# Patient Record
Sex: Male | Born: 1980 | Hispanic: No
Health system: Southern US, Community
[De-identification: ages and names within clinical notes are randomized; demographics above are authoritative.]

---
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
## 2005-05-17 ENCOUNTER — Emergency Department (HOSPITAL_COMMUNITY): Admission: EM | Admit: 2005-05-17 | Discharge: 2005-05-17 | Payer: Self-pay | Admitting: Emergency Medicine

## 2008-07-16 ENCOUNTER — Emergency Department (HOSPITAL_COMMUNITY): Admission: EM | Admit: 2008-07-16 | Discharge: 2008-07-16 | Payer: Self-pay | Admitting: Emergency Medicine
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
## 2019-05-21 ENCOUNTER — Ambulatory Visit: Payer: Self-pay

## 2019-08-27 ENCOUNTER — Emergency Department (HOSPITAL_COMMUNITY)
Admission: EM | Admit: 2019-08-27 | Discharge: 2019-08-27 | Disposition: A | Discharge status: Home / Self Care | Payer: Medicaid Other | Attending: Emergency Medicine | Admitting: Emergency Medicine

## 2019-08-27 ENCOUNTER — Encounter (HOSPITAL_COMMUNITY): Payer: Self-pay | Admitting: *Deleted

## 2019-08-27 DIAGNOSIS — R1110 Vomiting, unspecified: Secondary | ICD-10-CM | POA: Diagnosis not present

## 2019-08-27 DIAGNOSIS — Z5321 Procedure and treatment not carried out due to patient leaving prior to being seen by health care provider: Secondary | ICD-10-CM | POA: Diagnosis not present

## 2019-08-27 DIAGNOSIS — R109 Unspecified abdominal pain: Secondary | ICD-10-CM | POA: Diagnosis not present

## 2019-08-27 LAB — CBC
HCT: 50.7 % (ref 39.0–52.0)
Hemoglobin: 16.8 g/dL (ref 13.0–17.0)
MCH: 31.6 pg (ref 26.0–34.0)
MCHC: 33.1 g/dL (ref 30.0–36.0)
MCV: 95.3 fL (ref 80.0–100.0)
Platelets: 294 10*3/uL (ref 150–400)
RBC: 5.32 MIL/uL (ref 4.22–5.81)
RDW: 13.2 % (ref 11.5–15.5)
WBC: 12.7 10*3/uL — ABNORMAL HIGH (ref 4.0–10.5)
nRBC: 0 % (ref 0.0–0.2)

## 2019-08-27 LAB — COMPREHENSIVE METABOLIC PANEL
ALT: 344 U/L — ABNORMAL HIGH (ref 0–44)
AST: 434 U/L — ABNORMAL HIGH (ref 15–41)
Albumin: 4.2 g/dL (ref 3.5–5.0)
Alkaline Phosphatase: 81 U/L (ref 38–126)
Anion gap: 10 (ref 5–15)
BUN: 14 mg/dL (ref 6–20)
CO2: 23 mmol/L (ref 22–32)
Calcium: 8.7 mg/dL — ABNORMAL LOW (ref 8.9–10.3)
Chloride: 106 mmol/L (ref 98–111)
Creatinine, Ser: 1.15 mg/dL (ref 0.61–1.24)
GFR calc Af Amer: >60 mL/min (ref >60)
GFR calc non Af Amer: >60 mL/min (ref >60)
Glucose, Bld: 121 mg/dL — ABNORMAL HIGH (ref 70–99)
Potassium: 3.7 mmol/L (ref 3.5–5.1)
Sodium: 139 mmol/L (ref 135–145)
Total Bilirubin: 1.3 mg/dL — ABNORMAL HIGH (ref 0.3–1.2)
Total Protein: 7.4 g/dL (ref 6.5–8.1)

## 2019-08-27 LAB — LIPASE, BLOOD: Lipase: 297 U/L — ABNORMAL HIGH (ref 11–51)

## 2019-08-27 NOTE — ED Notes (Signed)
Patient stated that he was going to leave couldn't stay any longer

## 2019-08-27 NOTE — ED Triage Notes (Signed)
To ED via [facility] for eval of abd pain and vomiting since last pm. Pt states he ate pizza around lunch yesterday.

## 2021-11-10 ENCOUNTER — Ambulatory Visit (HOSPITAL_COMMUNITY)
Admission: EM | Admit: 2021-11-10 | Discharge: 2021-11-11 | Disposition: A | Discharge status: Home / Self Care | Payer: Medicaid Other | Attending: Nurse Practitioner | Admitting: Nurse Practitioner

## 2021-11-10 DIAGNOSIS — Z7689 Persons encountering health services in other specified circumstances: Secondary | ICD-10-CM

## 2021-11-10 DIAGNOSIS — F419 Anxiety disorder, unspecified: Secondary | ICD-10-CM | POA: Insufficient documentation

## 2021-11-10 DIAGNOSIS — F909 Attention-deficit hyperactivity disorder, unspecified type: Secondary | ICD-10-CM | POA: Insufficient documentation

## 2021-11-10 NOTE — ED Triage Notes (Signed)
Pt presents to [facility] voluntarily, unaccompanied at this time. Pt reports that his wife passed away, but he does not believe that she is dead. Pt trailed on and hopped from one topic to another adn was hard to follow at times. pt also shared that he has been trying to get his son back as he was removed from the home due to lack of appropriate resources. Pt stated diagnosis of ADHD and anxiety and is being followed by [facility] psychiatric services. Pt is prescribed Seroquel, Klonopin and Adderral and pt stated he is compliant. Pt denies SI, HI, AVH and substance/alcohol use.
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
## 2021-11-11 NOTE — Discharge Instructions (Addendum)
  Discharge recommendations:  Patient is to take medications as prescribed. Please see information for follow-up appointment with psychiatry and therapy. Please follow up with your primary care provider for all medical related needs.   Therapy: We recommend that patient participate in individual therapy to address mental health concerns.  Medications: The parent/guardian is to contact a medical professional and/or outpatient provider to address any new side effects that develop. Parent/guardian should update outpatient providers of any new medications and/or medication changes.   Atypical antipsychotics: If you are prescribed an atypical antipsychotic, it is recommended that your height, weight, BMI, blood pressure, fasting lipid panel, and fasting blood sugar be monitored by your outpatient providers.  Safety:  The patient should abstain from use of illicit substances/drugs and abuse of any medications. If symptoms worsen or do not continue to improve or if the patient becomes actively suicidal or homicidal then it is recommended that the patient return to the closest hospital emergency department, the [facility], or call 911 for further evaluation and treatment. National Suicide Prevention Lifeline 1-800-SUICIDE or 3645148649.  About 988 988 offers 24/7 access to trained crisis counselors who can help people experiencing mental health-related distress. People can call or text 988 or chat 988lifeline.org for themselves or if they are worried about a loved one who may need crisis support.  Crisis Mobile: [facility]:                     (586) [phone] (for crisis response 24 hours a day) [place]:                                            [phone]    [place] Address: [address], [place], [place] [address] Hours:  Closed ? Opens 8?AM Phone: [phone]  [facility] [place]  [phone]  [place]. Address: [address], [place], [place] [address] Hours:  Closed ? Opens 9?AM Phone: [phone]   [facility] [address] [place], [place] [address] [email or web address] [employer] [address] [address] [place], [place] [address] [facility] and [place],  [address] [email or web address] [employer] [address] [place],  [address] [email or web address]

## 2021-11-11 NOTE — Progress Notes (Signed)
[facility] entered resources into AVS  Medina Degraffenreid [facility] 

## 2021-11-11 NOTE — ED Provider Notes (Signed)
Behavioral Health Urgent Care Medical Screening Exam  Patient Name: Tyler Thomas MRN: [id number] Date of Evaluation: 11/11/21 Chief Complaint: "I need my son back, and a mental health examination" Diagnosis:  Final diagnoses:  Encounter for psychiatric assessment    History of Present illness: Tyler Thomas is a 41 y.o. male.  With self-reported psychiatric history of ADHD and anxiety who presented to [facility] voluntarily as a walk-in requesting psychiatric evaluation/examination to fulfil a court ordered process to get his son back.  Patient denies SI, denies HI, denies AVH.  Patient describes his stressors as missing his court date on 11/09/21, his son living in foster care.  Patient reports "I was supposed to go to court on 25 October but I forgot because I had a job interview and I missed my court date but I need the job".  Reports he was given 2 different court dates, the first was October 18, and he was not notified of the new date Patient reports the court date was to see how he was going to be able to get his son back".  Patient reports "everybody is playing with me and even my whole family at this point.  I was talking to my wife's sister Lucita Ferrara and she sent me 58 photos of my wife that passed away which I don't think It's true, and they are giving me all these things to sign but I think they are doing all these because they are trying to take my son away from me".   Patient reports he does not know his wife's sister's number when provider requested for it. Patient reports "my wife's brother told me she died of a heart attack, but I don't believe him because she was never sick, and I keep seeing photos of her on Facebook".   Patient reports he was married for 12 to 80 years and describes his wife as a Corporate investment banker, Occupational psychologist, and liar and reports they split up 6 to 7 years ago and she left town a year and a half ago.   Patient reports "I was separated from my wife because she cheated on me  and she went to live with her dad and brother in [place] and for 2-1/2 years she did not do anything to try to get her son back, and her sister is trying to mess with me.  Patient reports my son Tyler Thomas is 68 years old, and he was staying with me but he got taken away after I lost my job and could not provide power to the home after I lost a lot of money in my [employer] account, and I could not get help from nobody and DSS took him and he is now in foster care, but the lady that has him will not let me talk to him.    Patient reports "I feel isolated like everyone has ghosted me and I want my son back and I am starting a new job on Wednesday, and now I got a car and an apartment.  Reports he lives alone and denies access to a gun or weapon.  Patient denies substance use or abuse.  Patient reports his sleep and appetite as good.  Patient reports he is followed by Dr.Kaur and is prescribed Seroquel, Klonopin, and Adderall for his ADHD and anxiety.  Patient is requesting the [facility] help him get in touch with his son's foster parent so he can talk to the lady (Amy in  [place]) as he is unable  to get in touch with her.  Both encouragement and reassurance provided about ongoing stressors.  Patient provided with opportunity for questions.  On evaluation, patient is alert, oriented x 3, and cooperative. Speech is clear and coherent. Pt appears casual. Eye contact is good. Mood is euthymic, affect is congruent with mood. Thought process and thought content is coherent. Pt denies SI/HI/AVH. There is no indication that the patient is responding to internal stimuli. No delusions elicited during this assessment.    Psychiatric Specialty Exam  Presentation  General Appearance:Appropriate for Environment  Eye Contact:Good  Speech:Clear and Coherent  Speech Volume:Normal  Handedness:Right   Mood and Affect  Mood: Euthymic  Affect: Congruent   Thought Process  Thought  Processes: Coherent  Descriptions of Associations:Intact  Orientation:Full (Time, Place and Person)  Thought Content:WDL    Hallucinations:None  Ideas of Reference:None  Suicidal Thoughts:No  Homicidal Thoughts:No   Sensorium  Memory: Immediate Good  Judgment: Fair  Insight: Good   Executive Functions  Concentration: Good  Attention Span: Good  Recall: Good  Fund of Knowledge: Good  Language: Good   Psychomotor Activity  Psychomotor Activity: Normal   Assets  Assets: Communication Skills; Desire for Improvement; Housing; Resilience   Sleep  Sleep: Good  Number of hours: No data recorded  Nutritional Assessment (For OBS and FBC admissions only) Has the patient had a weight loss or gain of 10 pounds or more in the last 3 months?: No Has the patient had a decrease in food intake/or appetite?: No Does the patient have dental problems?: No Does the patient have eating habits or behaviors that may be indicators of an eating disorder including binging or inducing vomiting?: No Has the patient recently lost weight without trying?: 0 Has the patient been eating poorly because of a decreased appetite?: 0 Malnutrition Screening Tool Score: 0    Physical Exam: Physical Exam Constitutional:      General: He is not in acute distress.    Appearance: He is normal weight. He is not diaphoretic.  HENT:     Head: Normocephalic.     Right Ear: External ear normal.     Left Ear: External ear normal.     Nose: No congestion.  Eyes:     General:        Right eye: No discharge.        Left eye: No discharge.  Pulmonary:     Effort: Pulmonary effort is normal. No respiratory distress.  Chest:     Chest wall: No tenderness.  Neurological:     Mental Status: He is alert and oriented to person, place, and time.  Psychiatric:        Attention and Perception: Attention and perception normal.        Mood and Affect: Mood and affect normal.         Speech: Speech normal.        Behavior: Behavior is cooperative.        Thought Content: Thought content normal. Thought content is not paranoid or delusional. Thought content does not include homicidal or suicidal ideation. Thought content does not include homicidal or suicidal plan.        Cognition and Memory: Cognition and memory normal.        Judgment: Judgment normal.    Review of Systems  Constitutional:  Negative for chills, diaphoresis and fever.  HENT:  Negative for congestion.   Eyes:  Negative for discharge.  Respiratory:  Negative for cough, shortness  of breath and wheezing.   Cardiovascular:  Negative for chest pain and palpitations.  Gastrointestinal:  Negative for diarrhea, nausea and vomiting.  Neurological:  Negative for tingling, seizures, loss of consciousness, weakness and headaches.  Psychiatric/Behavioral:  Negative for depression, hallucinations, memory loss, substance abuse and suicidal ideas. The patient is not nervous/anxious and does not have insomnia.    Blood pressure (!) 140/102, pulse (!) 121, temperature 97.9 F (36.6 C), temperature source Oral, resp. rate 15, SpO2 97 %. There is no height or weight on file to calculate BMI.  Musculoskeletal: Strength & Muscle Tone: within normal limits Gait & Station: normal Patient leans: N/A   [facility] Discharge Disposition for Follow up and Recommendations: Based on my evaluation the patient does not appear to have an emergency medical condition and can be discharged with resources and follow up care in outpatient services for Medication Management and Individual Therapy  Recommend discharge to home/self and follow-up with outpatient psychiatric provider for resources as needed.  Patient provided with outpatient resources by LCSW as requested.  Patient does not meet inpatient psychiatric admission criteria or IVC criteria.  There is no evidence of imminent risk of harm to self or others.  Please refrain from  using alcohol or illicit substances, as they can affect your mood and can cause depression, anxiety or other concerning symptoms.  Alcohol can increase the chance that a person will make reckless decisions, like attempting suicide, and can increase the lethality of a drug overdose.   Discussed calling crisis line, 911, or going to the ED if condition changes or worsens.    Discussed elevated blood pressure and heart rate, pt educated on risks of nontreatment.  Patient declined intervention/treatment at this time and verbalized his understanding.Discussed follow-up with PCP for medical issues, concerns, or health needs.  Disp-patient discharged and condition at discharge is stable.    Randon Goldsmith, NP 11/11/2021, 12:15 AM

## 2022-02-19 ENCOUNTER — Inpatient Hospital Stay (HOSPITAL_COMMUNITY)
Admission: EM | Admit: 2022-02-19 | Discharge: 2022-02-25 | DRG: 460 | Disposition: A | Discharge status: Home / Self Care | Payer: BC Managed Care – PPO | Attending: Neurological Surgery | Admitting: Neurological Surgery

## 2022-02-19 ENCOUNTER — Inpatient Hospital Stay (HOSPITAL_COMMUNITY): Payer: BC Managed Care – PPO

## 2022-02-19 ENCOUNTER — Emergency Department (HOSPITAL_COMMUNITY): Payer: BC Managed Care – PPO

## 2022-02-19 DIAGNOSIS — R569 Unspecified convulsions: Principal | ICD-10-CM

## 2022-02-19 DIAGNOSIS — S32039A Unspecified fracture of third lumbar vertebra, initial encounter for closed fracture: Secondary | ICD-10-CM | POA: Diagnosis present

## 2022-02-19 DIAGNOSIS — Z79899 Other long term (current) drug therapy: Secondary | ICD-10-CM

## 2022-02-19 DIAGNOSIS — G40409 Other generalized epilepsy and epileptic syndromes, not intractable, without status epilepticus: Secondary | ICD-10-CM | POA: Diagnosis present

## 2022-02-19 DIAGNOSIS — Z8673 Personal history of transient ischemic attack (TIA), and cerebral infarction without residual deficits: Secondary | ICD-10-CM

## 2022-02-19 DIAGNOSIS — T4276XA Underdosing of unspecified antiepileptic and sedative-hypnotic drugs, initial encounter: Secondary | ICD-10-CM | POA: Diagnosis present

## 2022-02-19 DIAGNOSIS — Z602 Problems related to living alone: Secondary | ICD-10-CM | POA: Diagnosis present

## 2022-02-19 DIAGNOSIS — M48061 Spinal stenosis, lumbar region without neurogenic claudication: Secondary | ICD-10-CM | POA: Diagnosis present

## 2022-02-19 DIAGNOSIS — G40109 Localization-related (focal) (partial) symptomatic epilepsy and epileptic syndromes with simple partial seizures, not intractable, without status epilepticus: Secondary | ICD-10-CM | POA: Diagnosis not present

## 2022-02-19 DIAGNOSIS — S32001A Stable burst fracture of unspecified lumbar vertebra, initial encounter for closed fracture: Secondary | ICD-10-CM | POA: Diagnosis not present

## 2022-02-19 DIAGNOSIS — S32041A Stable burst fracture of fourth lumbar vertebra, initial encounter for closed fracture: Principal | ICD-10-CM | POA: Diagnosis present

## 2022-02-19 DIAGNOSIS — W182XXA Fall in (into) shower or empty bathtub, initial encounter: Secondary | ICD-10-CM | POA: Diagnosis present

## 2022-02-19 DIAGNOSIS — F419 Anxiety disorder, unspecified: Secondary | ICD-10-CM | POA: Diagnosis present

## 2022-02-19 LAB — CBC
HCT: 44.6 % (ref 39.0–52.0)
Hemoglobin: 15.5 g/dL (ref 13.0–17.0)
MCH: 31.8 pg (ref 26.0–34.0)
MCHC: 34.8 g/dL (ref 30.0–36.0)
MCV: 91.4 fL (ref 80.0–100.0)
Platelets: 256 10*3/uL (ref 150–400)
RBC: 4.88 MIL/uL (ref 4.22–5.81)
RDW: 12.5 % (ref 11.5–15.5)
WBC: 15.7 10*3/uL — ABNORMAL HIGH (ref 4.0–10.5)
nRBC: 0 % (ref 0.0–0.2)

## 2022-02-19 LAB — RAPID URINE DRUG SCREEN, HOSP PERFORMED
Amphetamines: NOT DETECTED
Barbiturates: NOT DETECTED
Benzodiazepines: NOT DETECTED
Cocaine: NOT DETECTED
Opiates: NOT DETECTED
Tetrahydrocannabinol: NOT DETECTED

## 2022-02-19 LAB — I-STAT CHEM 8, ED
BUN: 15 mg/dL (ref 6–20)
Calcium, Ion: 1.13 mmol/L — ABNORMAL LOW (ref 1.15–1.40)
Chloride: 108 mmol/L (ref 98–111)
Creatinine, Ser: 1 mg/dL (ref 0.61–1.24)
Glucose, Bld: 127 mg/dL — ABNORMAL HIGH (ref 70–99)
HCT: 47 % (ref 39.0–52.0)
Hemoglobin: 16 g/dL (ref 13.0–17.0)
Potassium: 4.3 mmol/L (ref 3.5–5.1)
Sodium: 138 mmol/L (ref 135–145)
TCO2: 18 mmol/L — ABNORMAL LOW (ref 22–32)

## 2022-02-19 LAB — CBG MONITORING, ED: Glucose-Capillary: 140 mg/dL — ABNORMAL HIGH (ref 70–99)

## 2022-02-19 LAB — ETHANOL: Alcohol, Ethyl (B): <10 mg/dL (ref <10)

## 2022-02-19 LAB — CREATININE, SERUM
Creatinine, Ser: 1.04 mg/dL (ref 0.61–1.24)
GFR, Estimated: >60 mL/min (ref >60)

## 2022-02-19 LAB — SURGICAL PCR SCREEN
MRSA, PCR: NEGATIVE
Staphylococcus aureus: POSITIVE — AB

## 2022-02-19 MED ORDER — LEVETIRACETAM IN NACL 1000 MG/100ML IV SOLN
1000.0000 mg | Freq: Once | INTRAVENOUS | Status: AC
  Administered 2022-02-19: 1000 mg via INTRAVENOUS
  Filled 2022-02-19: qty 100

## 2022-02-19 MED ORDER — CLONAZEPAM 0.25 MG PO TBDP
2.0000 mg | ORAL_TABLET | Freq: Two times a day (BID) | ORAL | Status: DC
  Administered 2022-02-19 – 2022-02-25 (×11): 2 mg via ORAL
  Filled 2022-02-19 (×11): qty 8

## 2022-02-19 MED ORDER — ENOXAPARIN SODIUM 40 MG/0.4ML IJ SOSY
40.0000 mg | PREFILLED_SYRINGE | INTRAMUSCULAR | Status: DC
  Administered 2022-02-19: 40 mg via SUBCUTANEOUS
  Filled 2022-02-19: qty 0.4

## 2022-02-19 MED ORDER — LEVETIRACETAM 500 MG PO TABS
500.0000 mg | ORAL_TABLET | Freq: Two times a day (BID) | ORAL | Status: DC
  Administered 2022-02-19 – 2022-02-25 (×12): 500 mg via ORAL
  Filled 2022-02-19 (×12): qty 1

## 2022-02-19 MED ORDER — DOCUSATE SODIUM 100 MG PO CAPS
100.0000 mg | ORAL_CAPSULE | Freq: Two times a day (BID) | ORAL | Status: DC
  Administered 2022-02-19 – 2022-02-20 (×3): 100 mg via ORAL
  Filled 2022-02-19 (×3): qty 1

## 2022-02-19 MED ORDER — GADOBUTROL 1 MMOL/ML IV SOLN
8.5000 mL | Freq: Once | INTRAVENOUS | Status: AC | PRN
  Administered 2022-02-19: 8.5 mL via INTRAVENOUS

## 2022-02-19 MED ORDER — RINGERS IV SOLN: INTRAVENOUS | Status: DC

## 2022-02-19 MED ORDER — KETOROLAC TROMETHAMINE 15 MG/ML IJ SOLN
15.0000 mg | Freq: Three times a day (TID) | INTRAMUSCULAR | Status: AC | PRN
  Administered 2022-02-19 – 2022-02-23 (×7): 15 mg via INTRAVENOUS
  Filled 2022-02-19 (×7): qty 1

## 2022-02-19 MED ORDER — LORAZEPAM 2 MG/ML IJ SOLN
1.0000 mg | Freq: Once | INTRAMUSCULAR | Status: AC
  Administered 2022-02-19: 1 mg via INTRAVENOUS
  Filled 2022-02-19: qty 1

## 2022-02-19 MED ORDER — MUPIROCIN 2 % EX OINT
1.0000 | TOPICAL_OINTMENT | Freq: Two times a day (BID) | CUTANEOUS | Status: AC
  Administered 2022-02-19 – 2022-02-24 (×9): 1 via NASAL
  Filled 2022-02-19 (×4): qty 22

## 2022-02-19 NOTE — ED Triage Notes (Signed)
Pt BIBA [facility] for lumbar back pain s/p seizures ~3hrs ago. [facility] reports pt ran out of klonopin 3 days ago, usually takes 2x2mg  daily. Hx of seizures when running out before. Denies head trauma, no blood thinners. Bit his tongue. Pt talking very low in triage, mostly answering with nods and head shakes. A&Ox3. VSS

## 2022-02-19 NOTE — ED Provider Notes (Signed)
[place] [facility] Provider Note   CSN: [id number] Arrival date & time: 02/19/22  3790     History  Chief Complaint  Patient presents with   Seizures    Tyler Thomas is a 42 y.o. male.  HPI 11 male with presents today with history of seizure disorder, not taking his Klonopin for several days and reports 2 seizures today.  He states he remembers having the seizures.  He was in the shower and was sitting and then was on the ground.  Denies striking his head or loss of consciousness.    Home Medications Prior to Admission medications   Not on File      Allergies    Patient has no known allergies.    Review of Systems   Review of Systems  Physical Exam Updated Vital Signs BP 125/79   Pulse 82   Temp 98 F (36.7 C) (Oral)   Resp 17   Ht 1.829 m (6')   Wt 86.2 kg   SpO2 96%   BMI 25.77 kg/m  Physical Exam Vitals and nursing note reviewed.  Constitutional:      Appearance: Normal appearance. He is well-developed.  HENT:     Head: Normocephalic and atraumatic.     Right Ear: External ear normal.     Left Ear: External ear normal.     Nose: Nose normal.     Mouth/Throat:     Comments: Tongue contusion Eyes:     Extraocular Movements: Extraocular movements intact.  Neck:     Trachea: No tracheal deviation.  Cardiovascular:     Rate and Rhythm: Normal rate and regular rhythm.  Pulmonary:     Effort: Pulmonary effort is normal.  Abdominal:     General: Abdomen is flat.     Palpations: Abdomen is soft.  Musculoskeletal:        General: Normal range of motion.     Cervical back: Normal range of motion and neck supple.     Comments: Some tenderness over mid lumbar spine No obvious external signs of trauma noted  Skin:    General: Skin is warm and dry.  Neurological:     Mental Status: He is alert and oriented to person, place, and time. Mental status is at baseline.     Cranial Nerves: No cranial nerve deficit.      Sensory: No sensory deficit.     Motor: No weakness.     Coordination: Coordination normal.  Psychiatric:        Mood and Affect: Mood normal.        Behavior: Behavior normal.     ED Results / Procedures / Treatments   Labs (all labs ordered are listed, but only abnormal results are displayed) Labs Reviewed  CBG MONITORING, ED - Abnormal; Notable for the following components:      Result Value   Glucose-Capillary 140 (*)    All other components within normal limits  I-STAT CHEM 8, ED - Abnormal; Notable for the following components:   Glucose, Bld 127 (*)    Calcium, Ion 1.13 (*)    TCO2 18 (*)    All other components within normal limits  RAPID URINE DRUG SCREEN, HOSP PERFORMED  ETHANOL    EKG None  Radiology CT Lumbar Spine Wo Contrast  Result Date: 02/19/2022 CLINICAL DATA:  Back trauma with no prior imaging. EXAM: CT LUMBAR SPINE WITHOUT CONTRAST TECHNIQUE: Multidetector CT imaging of the lumbar spine was  performed without intravenous contrast administration. Multiplanar CT image reconstructions were also generated. RADIATION DOSE REDUCTION: This exam was performed according to the departmental dose-optimization program which includes automated exposure control, adjustment of the mA and/or kV according to patient size and/or use of iterative reconstruction technique. COMPARISON:  07/16/2008 radiography discectomy FINDINGS: Segmentation: The T12 ribs are hypoplastic based on prior radiography. This gives 5 lumbar type vertebrae. Alignment: Physiologic Vertebrae: Comminuted L4 body fracture with anterior displacement of anterior fragments and advanced central height loss. The posterior arch is intact with nondisplaced left transverse process fracture at this level. mild posterosuperior bony retropulsion exacerbated by congenitally narrow spinal canal. L3 superior endplate fracture with mild depression, age-indeterminate. Chronic appearing T11 superior endplate fracture. Paraspinal  and other soft tissues: Paravertebral swelling at the level of fracture. No discrete hematoma. Disc levels: No herniation seen.  L4-5 spinal stenosis. IMPRESSION: 1. Burst type fracture of the L4 body. 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 Nondisplaced left transverse process fracture at this level. 2. L3 superior endplate fracture with mild depression, age-indeterminate. 3. T11 superior endplate fracture which appears chronic. Electronically Signed   By: Tiburcio Pea M.D.   On: 02/19/2022 08:27   CT Head Wo Contrast  Result Date: 02/19/2022 CLINICAL DATA:  Mental status changes.  Seizure. EXAM: CT HEAD WITHOUT CONTRAST TECHNIQUE: Contiguous axial images were obtained from the base of the skull through the vertex without intravenous contrast. RADIATION DOSE REDUCTION: This exam was performed according to the departmental dose-optimization program which includes automated exposure control, adjustment of the mA and/or kV according to patient size and/or use of iterative reconstruction technique. COMPARISON:  12/22/2018 FINDINGS: Brain: There is no evidence for acute hemorrhage, hydrocephalus, mass lesion, or abnormal extra-axial fluid collection. No definite CT evidence for acute infarction. 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 Vascular: No hyperdense vessel or unexpected calcification. Skull: No evidence for fracture. No worrisome lytic or sclerotic lesion. Sinuses/Orbits: Chronic mucosal thickening noted in the sphenoid sinuses and scattered ethmoid air cells. Mastoid sinuses are clear bilaterally. Visualized portions of the globes and intraorbital fat are unremarkable. Other: None. IMPRESSION: 1. 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 Otherwise no acute findings on today's exam. 2. Chronic paranasal sinus  disease. Electronically Signed   By: Kennith Center M.D.   On: 02/19/2022 08:24    Procedures .Critical Care  Performed by: Margarita Grizzle, MD Authorized by: Margarita Grizzle, MD   Critical care provider statement:    Critical care time (minutes):  65   Critical care was necessary to treat or prevent imminent or life-threatening deterioration of the following conditions:  Trauma and CNS failure or compromise   Critical care was time spent personally by me on the following activities:  Development of treatment plan with patient or surrogate, discussions with consultants, evaluation of patient's response to treatment, examination of patient, ordering and review of laboratory studies, ordering and review of radiographic studies, ordering and performing treatments and interventions, pulse oximetry, re-evaluation of patient's condition and review of old charts     Medications Ordered in ED Medications  LORazepam (ATIVAN) injection 1 mg (1 mg Intravenous Given 02/19/22 0743)  levETIRAcetam (KEPPRA) IVPB 1000 mg/100 mL premix (1,000 mg Intravenous New Bag/Given 02/19/22 1033)    ED Course/ Medical Decision Making/ A&P Clinical Course as of 02/19/22 1114  Sun Feb 19, 2022  0902 I-STAT 8 reviewed and interpreted and is significant for mildly decreased CO2 at 18 otherwise essentially within normal limits Alcohol is normal at 10  [DR]  1015 CT reviewed interpreted significant for burst type fracture of L4 body with posterior superior corner retropulsed bone mild degree but spinal stenosis high-grade given congenitally short pedicles [DR]  1016 L3 superior endplate fracture with mild depression [DR]    Clinical Course User Index [DR] Pattricia Boss, MD   {  11:14 AM Patient awake and alert Medical Decision Making Amount and/or Complexity of Data Reviewed Labs: ordered. Radiology: ordered.  Risk Prescription drug management.   42 year old male with history of known seizure disorder presents today  after seizure.  Patient reports that he has not been taking his Klonopin for the past several days because he ran out of it.  He has been drinking some alcohol.  He states that he has pain in his back since the seizure when he fell in the shower.  He denies striking his head or loss of consciousness.  In fact, he reports that he remembers the seizure.  However, he has multiple contusions on his tongue that would be consistent with having a grand mal seizure. Differential diagnosis includes but is not limited to seizure due to lack of compliance, seizure due to alcohol withdrawal, other intracerebral event, orthostasis, trauma, acute metabolic abnormalities   Co morbidities that complicate the patient evaluation  Will call you send seizure disorder   Additional history obtained:  Additional history obtained from history obtained from piton R and D a nurse External records from outside source obtained and reviewed including reviewed behavioral health urgent care note from 11/11/2021   Lab Tests:  I Ordered, and personally interpreted labs.  The pertinent results include: Sodium potassium chloride BUN/creatinine within normal limits Glucose mildly elevated at 127 Hemoglobin 16 Urine drug screen negative Alcohol less than 10   Imaging Studies ordered:  I ordered imaging studies including CT head and lumbar spine I independently visualized and interpreted imaging which showed CT head shows old frontal infarct no acute abnormalities CT spine shows burst fracture of L3 I agree with the radiologist interpretation   Cardiac Monitoring: / EKG:  The patient was maintained on a cardiac monitor.  I personally viewed and interpreted the cardiac monitored which showed an underlying rhythm of: Patient maintained on monitor with blood pressure 115/68 and heart rate normal   Consultations Obtained:  I requested consultation with the neurosurgery,  and discussed lab and imaging findings as well  as pertinent plan - they recommend: admission for surgical decompression   Problem List / ED Course / Critical interventions / Medication management  Seizure due to noncompliance with medications Burst fracture L3 due to seizure I ordered medication including Keppra and Ativan for seizure Reevaluation of the patient after these medicines showed that the patient stayed the same I have reviewed the patients home medicines and have made adjustments as needed   Social Determinants of Health:  Medication access   Test / Admission - Considered:  Mri-Discussed with Dr. Ellene Route he will obtain this is inpatient 1- seizure with known seizure disorder due to noncompliance with medications- out of meds  Patient treated here with Keppra and Ativan is awake and alert 2- l3 fracture secondary to #  Patient is neurologically intact care discussed with Dr. Ellene Route and will be admitted for decompression Final Clinical Impression(s) / ED Diagnoses Final diagnoses:  Seizure ([place])  Closed fracture of third  lumbar vertebra, unspecified fracture morphology, initial encounter [facility])    Rx / DC Orders ED Discharge Orders     None         Pattricia Boss, MD 02/19/22 1115

## 2022-02-19 NOTE — Consult Note (Signed)
Neurology Consultation Reason for Consult: Seizures  Requesting Physician: Kristeen Miss   CC: Seizure  History is obtained from: Patient and chart review   HPI: Tyler Thomas is a 42 y.o. male with a past medical history significant for anxiety and possibly ADHD/ADD on clonazepam, quetiapine and Adderall)  Spell # Started in his 77s,  - Semiology: Whole body stiffening, sometimes falls back. No bowel/bladder incontinence but always bites tongue  - Prodome: Stiffening all over reported to myself.  However to neurosurgery team the patient reported right arm twitching and flailing followed by loss of consciousness - Post-spell: Confused, difficulty talking, does not think he has clear unilateral weakness - Triggers: always in the setting of missed benzos (takes daily for anxiety) - Frequency: 4-5 lifetime events   Risk factors:  Birth and development: unknown  Febrile seizures in childhood: negative Significant head trauma: 2 episodes of LoC when hitting head during seizure Intracranial surgeries: No Mengingitis/Encephalitis history: No Family history of seizures or developmental delay: No  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   No past medical history on file.  On my review: Patient was unaware of prior stroke history.  Aware of only psychiatric history as documented above  No family history on file.  On my review: Notes seizure disorder in his brother as well which he thinks are triggered in the same setting  Social History:  He reports he does drink alcohol occasionally socially but denies any other substance use  Exam: Current vital signs: BP (!) 106/59 (BP Location: Left Arm)   Pulse 82   Temp 98.7 F (37.1 C) (Oral)   Resp 18   Ht 6' (1.829 m)   Wt 87.2 kg   SpO2 95%   BMI 26.07 kg/m  Vital signs in last 24  hours: Temp:  [97.8 F (36.6 C)-98.7 F (37.1 C)] 98.7 F (37.1 C) (02/04 2302) Pulse Rate:  [76-90] 82 (02/04 2302) Resp:  [16-22] 18 (02/04 2302) BP: (106-125)/(59-85) 106/59 (02/04 2302) SpO2:  [94 %-97 %] 95 % (02/04 2302) Weight:  [86.2 kg-87.2 kg] 87.2 kg (02/04 1251)   Physical Exam  Constitutional: Appears well-developed and well-nourished.  Winces with pain with any movement Psych: Affect flat Eyes: No scleral injection HENT: No oropharyngeal obstruction.  MSK: no joint deformities.  Cardiovascular: Normal rate and regular rhythm. Perfusing extremities well Respiratory: Effort normal, non-labored breathing GI: Soft.  No distension. There is no tenderness.  Skin: Warm dry and intact visible skin  Neuro: Mental Status: Patient is very sleepy at the time of my evaluation at but is oriented to person, place, month, year, and situation. Patient is able to give a clear and coherent history. No signs of aphasia or neglect Cranial Nerves: II: Visual Fields are full. Pupils are equal, round, and reactive to light.   III,IV, VI: EOMI without ptosis or diploplia.  V: Facial sensation is symmetric to light touch (reports pain in his face) VII: Facial movement is symmetric.  VIII: hearing is intact to voice X: Uvula elevates symmetrically XI: Shoulder shrug is symmetric. XII: tongue is midline without atrophy or fasciculations.  Motor: Confrontational strength testing deferred due to unstable fracture and pain, but he is using all 4 extremities slightly to adjust himself and grossly equal strength on this evaluation.  Slight pronation of bilateral upper extremities without drift Sensory:  Sensation is symmetric to light touch in all 4 extremities Cerebellar: Finger-nose intact bilaterally Gait:  Deferred due to unstable fracture   I have reviewed labs in epic and the results pertinent to this consultation are:  Basic Metabolic Panel: Recent Labs  Lab 02/19/22 0736  02/19/22 1532  NA 138  --   K 4.3  --   CL 108  --   GLUCOSE 127*  --   BUN 15  --   CREATININE 1.00 1.04    CBC: Recent Labs  Lab 02/19/22 0736 02/19/22 1532  WBC  --  15.7*  HGB 16.0 15.5  HCT 47.0 44.6  MCV  --  91.4  PLT  --  256   I have reviewed the images obtained:  MRI brain personally reviewed, agree with radiology:  1. No acute intracranial abnormality. 2. 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   Impression: Given the semiology described to neurosurgery (perhaps not described similarly to me due to patient's sleepiness and fatigue at the time of my evaluation) which correlates well with possible seizure focus on MRI brain, as well as multiple prior seizures, he meets criteria for focal epilepsy.  Recommendations:  # Focal seizures with secondary generalization -Routine EEG pending for completeness -Continue Keppra 500 twice daily at this time -Long-term would consider Depakote or lamotrigine as these medications can have dual effect for mood disorder in addition to seizure control.  Holding off on Depakote for right now due to potential for thrombocytopenia and need for urgent neurosurgical intervention.  Long-acting benzodiazepine such as Onfi would be another good option -Would taper short acting benzodiazepines with the help of his outpatient psychiatrist and transition to other mood controlling agents due to this recurrently being his seizure trigger - Neurology will follow up EEG but otherwise be available as needed, ambulatory referral to [facility] will be placed for close follow-up.  In discharge instructions family/patient should also be provided with the number to call for an appointment: [phone] Address: [address] [place], [place] [address]  # Remote stroke - ASA 81 mg daily for secondary prevention when cleared from neurosurgical perspective -  Outpatient workup given chronicity   [place] [phone]

## 2022-02-19 NOTE — H&P (Addendum)
Tyler Thomas is an 42 y.o. male.   Chief Complaint: L4 fracture status post grand mal seizure HPI: The patient is a 42 year old male who has a history of seizures in the past.  He notes that he takes Klonopin 2 mg twice a day and he had ran out of the medication the last 24 hours or so.  He was in the shower and had sat down as he felt unusual and he noticed his right arm started to twitch and flail uncontrollably.  He then does not remember anything.  He was brought into the hospital for evaluation was noted to have severe back pain a CT scan demonstrates the presence of an L4 compression fracture that appears to have a burst pattern with superior propulse fragment.  The patient has a congenital spinal stenosis and with the retropulsed fragment he has severe stenosis at the L4-L5 level.  Complains of severe pain in his back and he finds that he is bit more comfortable when he keeps his legs flexed.  He is admitted as it appears his fracture is unstable and will likely need surgical decompression and stabilization.  The history of seizure is a little less trait forward.  It appears that the patient has had 2 other seizures and each time they are related to withdrawal or abrupt stoppage of a benzodiazepine previously Xanax and this time Klonopin.  Whether he has a primary seizure disorder needs some clarification in addition to the best form of treatment for him needs to be ascertained.  He has been loaded with Keppra at this point and is being continued on Keppra.  No past medical history on file.  No past surgical history on file.  No family history on file. Social History:  has no history on file for tobacco use, alcohol use, and drug use.  Allergies: No Known Allergies  Medications Prior to Admission  Medication Sig Dispense Refill   amphetamine-dextroamphetamine (ADDERALL) 20 MG tablet Take 20 mg by mouth daily.     clonazePAM (KLONOPIN) 2 MG tablet Take 2 mg by mouth daily.     QUEtiapine  (SEROQUEL) 200 MG tablet Take 50-100 mg by mouth at bedtime as needed (sleep).      Results for orders placed or performed during the hospital encounter of 02/19/22 (from the past 48 hour(s))  CBG monitoring, ED     Status: Abnormal   Collection Time: 02/19/22  7:18 AM  Result Value Ref Range   Glucose-Capillary 140 (H) 70 - 99 mg/dL    Comment: Glucose reference range applies only to samples taken after fasting for at least 8 hours.  Ethanol     Status: None   Collection Time: 02/19/22  7:25 AM  Result Value Ref Range   Alcohol, Ethyl (B) <10 <10 mg/dL    Comment: (NOTE) Lowest detectable limit for serum alcohol is 10 mg/dL.  For medical purposes only. Performed at [facility], [place] [address], [place], [place] [address]   I-stat chem 8, ED (not at [facility], [facility] or [facility])     Status: Abnormal   Collection Time: 02/19/22  7:36 AM  Result Value Ref Range   Sodium 138 135 - 145 mmol/L   Potassium 4.3 3.5 - 5.1 mmol/L   Chloride 108 98 - 111 mmol/L   BUN 15 6 - 20 mg/dL   Creatinine, Ser 1.00 0.61 - 1.24 mg/dL   Glucose, Bld 127 (H) 70 - 99 mg/dL    Comment: Glucose reference range  applies only to samples taken after fasting for at least 8 hours.   Calcium, Ion 1.13 (L) 1.15 - 1.40 mmol/L   TCO2 18 (L) 22 - 32 mmol/L   Hemoglobin 16.0 13.0 - 17.0 g/dL   HCT 47.0 39.0 - 52.0 %  Rapid urine drug screen (hospital performed)     Status: None   Collection Time: 02/19/22  9:38 AM  Result Value Ref Range   Opiates NONE DETECTED NONE DETECTED   Cocaine NONE DETECTED NONE DETECTED   Benzodiazepines NONE DETECTED NONE DETECTED   Amphetamines NONE DETECTED NONE DETECTED   Tetrahydrocannabinol NONE DETECTED NONE DETECTED   Barbiturates NONE DETECTED NONE DETECTED    Comment: (NOTE) DRUG SCREEN FOR MEDICAL PURPOSES ONLY.  IF CONFIRMATION IS NEEDED FOR ANY PURPOSE, NOTIFY LAB WITHIN 5 DAYS.  LOWEST DETECTABLE LIMITS FOR URINE DRUG SCREEN Drug Class                      Cutoff (ng/mL) Amphetamine and metabolites    1000 Barbiturate and metabolites    200 Benzodiazepine                 200 Opiates and metabolites        300 Cocaine and metabolites        300 THC                            50 Performed at [facility], [place] [address], [place], [place] [address]    CT Lumbar Spine Wo Contrast  Result Date: 02/19/2022 CLINICAL DATA:  Back trauma with no prior imaging. EXAM: CT LUMBAR SPINE WITHOUT CONTRAST TECHNIQUE: Multidetector CT imaging of the lumbar spine was performed without intravenous contrast administration. Multiplanar CT image reconstructions were also generated. RADIATION DOSE REDUCTION: This exam was performed according to the departmental dose-optimization program which includes automated exposure control, adjustment of the mA and/or kV according to patient size and/or use of iterative reconstruction technique. COMPARISON:  07/16/2008 radiography discectomy FINDINGS: Segmentation: The T12 ribs are hypoplastic based on prior radiography. This gives 5 lumbar type vertebrae. Alignment: Physiologic Vertebrae: Comminuted L4 body fracture with anterior displacement of anterior fragments and advanced central height loss. The posterior arch is intact with nondisplaced left transverse process fracture at this level. mild posterosuperior bony retropulsion exacerbated by congenitally narrow spinal canal. L3 superior endplate fracture with mild depression, age-indeterminate. Chronic appearing T11 superior endplate fracture. Paraspinal and other soft tissues: Paravertebral swelling at the level of fracture. No discrete hematoma. Disc levels: No herniation seen.  L4-5 spinal stenosis. IMPRESSION: 1. Burst type fracture of the L4 body. 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 Nondisplaced left transverse process fracture at this level. 2. L3 superior endplate fracture  with mild depression, age-indeterminate. 3. T11 superior endplate fracture which appears chronic. Electronically Signed   By: Jorje Guild M.D.   On: 02/19/2022 08:27   CT Head Wo Contrast  Result Date: 02/19/2022 CLINICAL DATA:  Mental status changes.  Seizure. EXAM: CT HEAD WITHOUT CONTRAST TECHNIQUE: Contiguous axial images were obtained from the base of the skull through the vertex without intravenous contrast. RADIATION DOSE REDUCTION: This exam was performed according to the departmental dose-optimization program which includes automated exposure control, adjustment of the mA and/or kV according to patient size and/or use of iterative reconstruction technique. COMPARISON:  12/22/2018 FINDINGS: Brain: There  is no evidence for acute hemorrhage, hydrocephalus, mass lesion, or abnormal extra-axial fluid collection. No definite CT evidence for acute infarction. 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 Vascular: No hyperdense vessel or unexpected calcification. Skull: No evidence for fracture. No worrisome lytic or sclerotic lesion. Sinuses/Orbits: Chronic mucosal thickening noted in the sphenoid sinuses and scattered ethmoid air cells. Mastoid sinuses are clear bilaterally. Visualized portions of the globes and intraorbital fat are unremarkable. Other: None. IMPRESSION: 1. 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 Otherwise no acute findings on today's exam. 2. Chronic paranasal sinus disease. Electronically Signed   By: Misty Stanley M.D.   On: 02/19/2022 08:24    Review of Systems  Psychiatric/Behavioral:  The patient is nervous/anxious.   All other systems reviewed and are negative.   Blood pressure 120/79, pulse 76, temperature 98.5 F (36.9 C), temperature source Oral, resp. rate 18, height 6' (1.829 m), weight 87.2 kg, SpO2 96 %. Physical Exam Constitutional:      Appearance: Normal  appearance. He is normal weight.  HENT:     Head: Normocephalic and atraumatic.     Right Ear: Tympanic membrane, ear canal and external ear normal.     Left Ear: Tympanic membrane, ear canal and external ear normal.     Nose: Nose normal.     Mouth/Throat:     Mouth: Mucous membranes are moist.     Pharynx: Oropharynx is clear.     Comments: Patient has bitten his tongue severely and has significant swelling in the tongue Eyes:     Extraocular Movements: Extraocular movements intact.     Conjunctiva/sclera: Conjunctivae normal.     Pupils: Pupils are equal, round, and reactive to light.  Cardiovascular:     Rate and Rhythm: Normal rate and regular rhythm.     Pulses: Normal pulses.     Heart sounds: Normal heart sounds.  Pulmonary:     Effort: Pulmonary effort is normal.     Breath sounds: Normal breath sounds.  Abdominal:     General: Abdomen is flat. Bowel sounds are normal.     Palpations: Abdomen is soft.  Musculoskeletal:     Cervical back: Normal range of motion and neck supple.     Comments: Complains of pain in the left forearm.  Requests an x-ray  Skin:    General: Skin is warm and dry.     Capillary Refill: Capillary refill takes less than 2 seconds.  Neurological:     General: No focal deficit present.     Mental Status: He is alert.  Psychiatric:        Mood and Affect: Mood normal.        Behavior: Behavior normal.        Thought Content: Thought content normal.        Judgment: Judgment normal.      Assessment/Plan #1 L4 burst fracture with high-grade stenosis L4. 2.  Seizure disorder and seizure management.  Klonopin withdrawal  Plan: We will arrange a time to undergo surgical D compression and stabilization of the L4 vertebrae.  I like the patient to undergo an MRI of the lumbar spine prior to this.  He will remain at bedrest.  Will start a diet today.  Regarding the seizure disorder I would kindly request neurologic consultation to determine what the  best course of action in terms of management of this patient's medications  and his seizure would be.  The patient notes that he currently does not have a neurologist.  Stefani Dama, MD 02/19/2022, 3:14 PM

## 2022-02-19 NOTE — Progress Notes (Signed)
Pt admitted to the unit from [facility] ED via care link. Pt A&O x4; pt oriented to unit and room, seizure precaution protocol initiated, skin assessment completed with 2 RN per protocol. Pt skin intact with no opened wound or pressure ulcer. VSS, telemetry applied and verified with CCMD per protocol. Dr. Ellene Route called and notified of pt's arrival to the unit. Pt in bed with call light within reach. Will continue to closely monitor. Francis Gaines Renisha Cockrum RN   02/19/22 1251  Vitals  Temp 98.5 F (36.9 C)  Temp Source Oral  BP 120/79  MAP (mmHg) 93  BP Location Left Arm  BP Method Automatic  Patient Position (if appropriate) Lying  Pulse Rate 76  Pulse Rate Source Monitor  Resp 18  Level of Consciousness  Level of Consciousness Alert  MEWS COLOR  MEWS Score Color Green  Oxygen Therapy  SpO2 96 %  O2 Device Room Air  Height and Weight  Height 6' (1.829 m)  Weight 87.2 kg  Type of Scale Used Bed  Type of Weight Actual  BSA (Calculated - sq m) 2.1 sq meters  BMI (Calculated) 26.07  Weight in (lb) to have BMI = 25 183.9  MEWS Score  MEWS Temp 0  MEWS Systolic 0  MEWS Pulse 0  MEWS RR 0  MEWS LOC 0  MEWS Score 0

## 2022-02-20 ENCOUNTER — Other Ambulatory Visit: Payer: Self-pay | Admitting: Neurological Surgery

## 2022-02-20 ENCOUNTER — Inpatient Hospital Stay (HOSPITAL_COMMUNITY): Payer: BC Managed Care – PPO

## 2022-02-20 DIAGNOSIS — R569 Unspecified convulsions: Secondary | ICD-10-CM | POA: Diagnosis not present

## 2022-02-20 MED ORDER — LACTATED RINGERS IV SOLN: INTRAVENOUS | Status: DC

## 2022-02-20 NOTE — Progress Notes (Signed)
Pt transported off unit to MRI. P. Amo Dondra Rhett RN 

## 2022-02-20 NOTE — Progress Notes (Signed)
Pt back to the unit from MRI. Pt in bed with bed alarm on and call light within reach. Delia Heady RN

## 2022-02-20 NOTE — Procedures (Signed)
Patient Name: Tyler Thomas  MRN: [id number]  Epilepsy Attending: Lora Havens  Referring Physician/Provider: Donnetta Simpers, MD  Date: 02/20/2022 Duration: 22.17 mins  Patient history: 42 year old male with seizure-like activity.  EEG Drolet for seizure.  Level of alertness: Awake  AEDs during EEG study: Keppra, clonazepam  Technical aspects: This EEG study was done with scalp electrodes positioned according to the 10-20 International system of electrode placement. Electrical activity was reviewed with band pass filter of 1-70Hz , sensitivity of 7 uV/mm, display speed of 62mm/sec with a 60Hz  notched filter applied as appropriate. EEG data were recorded continuously and digitally stored.  Video monitoring was available and reviewed as appropriate.  Description: The posterior dominant rhythm consists of 9-10 Hz activity of moderate voltage (25-35 uV) seen predominantly in posterior head regions, symmetric and reactive to eye opening and eye closing. There is 15 to 18 Hz beta activity distributed symmetrically and diffusely.  Hyperventilation and photic stimulation were not performed.     IMPRESSION: This study is within normal limits. No seizures or epileptiform discharges were seen throughout the recording.  A normal interictal EEG does not exclude the diagnosis of epilepsy.  [name] Barbra Sarks

## 2022-02-20 NOTE — Anesthesia Preprocedure Evaluation (Signed)
Anesthesia Evaluation  Patient identified by MRN, date of birth, ID band Patient awake    Reviewed: Allergy & Precautions, NPO status , Patient's Chart, lab work & pertinent test results  History of Anesthesia Complications Negative for: history of anesthetic complications  Airway Mallampati: II  TM Distance: >3 FB Neck ROM: Full    Dental no notable dental hx.    Pulmonary neg pulmonary ROS   Pulmonary exam normal        Cardiovascular negative cardio ROS Normal cardiovascular exam     Neuro/Psych L4 Burst Fracture    GI/Hepatic negative GI ROS, Neg liver ROS,,,  Endo/Other  negative endocrine ROS    Renal/GU negative Renal ROS     Musculoskeletal   Abdominal   Peds  Hematology negative hematology ROS (+)   Anesthesia Other Findings Day of surgery medications reviewed with patient.  Reproductive/Obstetrics                              Anesthesia Physical Anesthesia Plan  ASA: 2  Anesthesia Plan: General   Post-op Pain Management: Tylenol PO (pre-op)* and Ketamine IV*   Induction: Intravenous  PONV Risk Score and Plan: 3 and Treatment may vary due to age or medical condition, Ondansetron, Dexamethasone and Midazolam  Airway Management Planned: Oral ETT  Additional Equipment: None  Intra-op Plan:   Post-operative Plan: Extubation in OR  Informed Consent: I have reviewed the patients History and Physical, chart, labs and discussed the procedure including the risks, benefits and alternatives for the proposed anesthesia with the patient or authorized representative who has indicated his/her understanding and acceptance.     Dental advisory given  Plan Discussed with: CRNA  Anesthesia Plan Comments:         Anesthesia Quick Evaluation

## 2022-02-20 NOTE — Progress Notes (Signed)
Patient ID: Tyler Thomas, male   DOB: September 27, 1980, 42 y.o.   MRN: [id number] Vital signs stable Motor function is good in lower extremities but patient in severe pain with any mobilization Plan for decompression and stabilization in am Discussed with patient, he agrees. Will obtain mri lumbar and consent.

## 2022-02-20 NOTE — H&P (View-Only) (Signed)
EEG complete - results pending 

## 2022-02-20 NOTE — Progress Notes (Signed)
EEG complete - results pending 

## 2022-02-21 ENCOUNTER — Inpatient Hospital Stay (HOSPITAL_COMMUNITY): Payer: BC Managed Care – PPO | Admitting: Certified Registered"

## 2022-02-21 ENCOUNTER — Inpatient Hospital Stay (HOSPITAL_COMMUNITY): Payer: BC Managed Care – PPO

## 2022-02-21 ENCOUNTER — Other Ambulatory Visit: Payer: Self-pay

## 2022-02-21 ENCOUNTER — Inpatient Hospital Stay (HOSPITAL_COMMUNITY)
Admission: EM | Disposition: A | Discharge status: Home / Self Care | Payer: Self-pay | Source: Home / Self Care | Attending: Neurological Surgery

## 2022-02-21 HISTORY — PX: HARVEST BONE GRAFT: SHX377

## 2022-02-21 SURGERY — POSTERIOR LUMBAR FUSION 2 LEVEL
Anesthesia: General | Site: Back

## 2022-02-21 MED ORDER — 0.9 % SODIUM CHLORIDE (POUR BTL) OPTIME
TOPICAL | Status: DC | PRN
  Administered 2022-02-21: 1000 mL

## 2022-02-21 MED ORDER — SODIUM CHLORIDE 0.9% FLUSH: 3.0000 mL | INTRAVENOUS | Status: DC | PRN

## 2022-02-21 MED ORDER — MORPHINE SULFATE (PF) 2 MG/ML IV SOLN
2.0000 mg | INTRAVENOUS | Status: DC | PRN
  Administered 2022-02-22 – 2022-02-23 (×2): 4 mg via INTRAVENOUS
  Filled 2022-02-21 (×2): qty 2

## 2022-02-21 MED ORDER — EPHEDRINE 5 MG/ML INJ
INTRAVENOUS | Status: AC
  Filled 2022-02-21: qty 5

## 2022-02-21 MED ORDER — PROPOFOL 10 MG/ML IV BOLUS
INTRAVENOUS | Status: AC
  Filled 2022-02-21: qty 20

## 2022-02-21 MED ORDER — FENTANYL CITRATE (PF) 250 MCG/5ML IJ SOLN
INTRAMUSCULAR | Status: AC
  Filled 2022-02-21: qty 5

## 2022-02-21 MED ORDER — PROPOFOL 10 MG/ML IV BOLUS
INTRAVENOUS | Status: DC | PRN
  Administered 2022-02-21: 180 mg via INTRAVENOUS

## 2022-02-21 MED ORDER — ONDANSETRON HCL 4 MG/2ML IJ SOLN
INTRAMUSCULAR | Status: AC
  Filled 2022-02-21: qty 2

## 2022-02-21 MED ORDER — DEXAMETHASONE SODIUM PHOSPHATE 10 MG/ML IJ SOLN
INTRAMUSCULAR | Status: AC
  Filled 2022-02-21: qty 1

## 2022-02-21 MED ORDER — LIDOCAINE-EPINEPHRINE 1 %-1:100000 IJ SOLN
INTRAMUSCULAR | Status: DC | PRN
  Administered 2022-02-21: 5 mL

## 2022-02-21 MED ORDER — PHENYLEPHRINE 80 MCG/ML (10ML) SYRINGE FOR IV PUSH (FOR BLOOD PRESSURE SUPPORT)
PREFILLED_SYRINGE | INTRAVENOUS | Status: AC
  Filled 2022-02-21: qty 10

## 2022-02-21 MED ORDER — DROPERIDOL 2.5 MG/ML IJ SOLN: 0.6250 mg | Freq: Once | INTRAMUSCULAR | Status: DC | PRN

## 2022-02-21 MED ORDER — ACETAMINOPHEN 10 MG/ML IV SOLN
INTRAVENOUS | Status: AC
  Filled 2022-02-21: qty 100

## 2022-02-21 MED ORDER — HYDROMORPHONE HCL 1 MG/ML IJ SOLN
INTRAMUSCULAR | Status: AC
  Filled 2022-02-21: qty 0.5

## 2022-02-21 MED ORDER — MIDAZOLAM HCL 2 MG/2ML IJ SOLN
INTRAMUSCULAR | Status: AC
  Filled 2022-02-21: qty 2

## 2022-02-21 MED ORDER — SODIUM CHLORIDE 0.9% FLUSH
3.0000 mL | Freq: Two times a day (BID) | INTRAVENOUS | Status: DC
  Administered 2022-02-21 – 2022-02-23 (×6): 3 mL via INTRAVENOUS

## 2022-02-21 MED ORDER — DEXAMETHASONE SODIUM PHOSPHATE 10 MG/ML IJ SOLN
INTRAMUSCULAR | Status: DC | PRN
  Administered 2022-02-21: 10 mg via INTRAVENOUS

## 2022-02-21 MED ORDER — CEFAZOLIN SODIUM-DEXTROSE 2-4 GM/100ML-% IV SOLN
2.0000 g | Freq: Three times a day (TID) | INTRAVENOUS | Status: AC
  Administered 2022-02-21 (×2): 2 g via INTRAVENOUS
  Filled 2022-02-21 (×2): qty 100

## 2022-02-21 MED ORDER — PHENOL 1.4 % MT LIQD: 1.0000 | OROMUCOSAL | Status: DC | PRN

## 2022-02-21 MED ORDER — BUPIVACAINE HCL (PF) 0.5 % IJ SOLN
INTRAMUSCULAR | Status: AC
  Filled 2022-02-21: qty 30

## 2022-02-21 MED ORDER — KETAMINE HCL 10 MG/ML IJ SOLN
INTRAMUSCULAR | Status: DC | PRN
  Administered 2022-02-21: 20 mg via INTRAVENOUS
  Administered 2022-02-21 (×3): 10 mg via INTRAVENOUS

## 2022-02-21 MED ORDER — SODIUM CHLORIDE 0.9 % IV SOLN: 250.0000 mL | INTRAVENOUS | Status: DC

## 2022-02-21 MED ORDER — PHENYLEPHRINE HCL-NACL 20-0.9 MG/250ML-% IV SOLN
INTRAVENOUS | Status: DC | PRN
  Administered 2022-02-21: 20 ug/min via INTRAVENOUS

## 2022-02-21 MED ORDER — HYDROMORPHONE HCL 1 MG/ML IJ SOLN
INTRAMUSCULAR | Status: DC | PRN
  Administered 2022-02-21: .5 mg via INTRAVENOUS

## 2022-02-21 MED ORDER — THROMBIN 5000 UNITS EX SOLR
OROMUCOSAL | Status: DC | PRN
  Administered 2022-02-21 (×2): 5 mL via TOPICAL

## 2022-02-21 MED ORDER — LIDOCAINE-EPINEPHRINE 1 %-1:100000 IJ SOLN
INTRAMUSCULAR | Status: AC
  Filled 2022-02-21: qty 1

## 2022-02-21 MED ORDER — BUPIVACAINE HCL (PF) 0.5 % IJ SOLN
INTRAMUSCULAR | Status: DC | PRN
  Administered 2022-02-21: 25 mL

## 2022-02-21 MED ORDER — DOCUSATE SODIUM 100 MG PO CAPS
100.0000 mg | ORAL_CAPSULE | Freq: Two times a day (BID) | ORAL | Status: DC
  Administered 2022-02-21 – 2022-02-25 (×9): 100 mg via ORAL
  Filled 2022-02-21 (×9): qty 1

## 2022-02-21 MED ORDER — THROMBIN 5000 UNITS EX SOLR
CUTANEOUS | Status: AC
  Filled 2022-02-21: qty 5000

## 2022-02-21 MED ORDER — LEVETIRACETAM IN NACL 500 MG/100ML IV SOLN
500.0000 mg | Freq: Once | INTRAVENOUS | Status: AC
  Administered 2022-02-21: 500 mg via INTRAVENOUS
  Filled 2022-02-21: qty 100

## 2022-02-21 MED ORDER — LIDOCAINE 2% (20 MG/ML) 5 ML SYRINGE
INTRAMUSCULAR | Status: DC | PRN
  Administered 2022-02-21: 100 mg via INTRAVENOUS

## 2022-02-21 MED ORDER — CEFAZOLIN SODIUM-DEXTROSE 2-3 GM-%(50ML) IV SOLR
INTRAVENOUS | Status: DC | PRN
  Administered 2022-02-21: 2 g via INTRAVENOUS

## 2022-02-21 MED ORDER — LACTATED RINGERS IV SOLN: INTRAVENOUS | Status: DC

## 2022-02-21 MED ORDER — LIDOCAINE 2% (20 MG/ML) 5 ML SYRINGE
INTRAMUSCULAR | Status: AC
  Filled 2022-02-21: qty 5

## 2022-02-21 MED ORDER — MENTHOL 3 MG MT LOZG: 1.0000 | LOZENGE | OROMUCOSAL | Status: DC | PRN

## 2022-02-21 MED ORDER — HYDROMORPHONE HCL 1 MG/ML IJ SOLN: 0.2500 mg | INTRAMUSCULAR | Status: DC | PRN

## 2022-02-21 MED ORDER — BISACODYL 10 MG RE SUPP: 10.0000 mg | Freq: Every day | RECTAL | Status: DC | PRN

## 2022-02-21 MED ORDER — METHOCARBAMOL 1000 MG/10ML IJ SOLN
500.0000 mg | Freq: Four times a day (QID) | INTRAVENOUS | Status: DC | PRN
  Filled 2022-02-21 (×2): qty 5

## 2022-02-21 MED ORDER — OXYCODONE-ACETAMINOPHEN 5-325 MG PO TABS
1.0000 | ORAL_TABLET | ORAL | Status: DC | PRN
  Administered 2022-02-21 – 2022-02-25 (×14): 2 via ORAL
  Filled 2022-02-21 (×14): qty 2

## 2022-02-21 MED ORDER — ONDANSETRON HCL 4 MG/2ML IJ SOLN: 4.0000 mg | Freq: Four times a day (QID) | INTRAMUSCULAR | Status: DC | PRN

## 2022-02-21 MED ORDER — QUETIAPINE FUMARATE 100 MG PO TABS
100.0000 mg | ORAL_TABLET | Freq: Every day | ORAL | Status: DC
  Administered 2022-02-21 – 2022-02-24 (×4): 100 mg via ORAL
  Filled 2022-02-21 (×4): qty 1

## 2022-02-21 MED ORDER — ROCURONIUM BROMIDE 10 MG/ML (PF) SYRINGE
PREFILLED_SYRINGE | INTRAVENOUS | Status: DC | PRN
  Administered 2022-02-21: 30 mg via INTRAVENOUS
  Administered 2022-02-21: 60 mg via INTRAVENOUS
  Administered 2022-02-21: 20 mg via INTRAVENOUS
  Administered 2022-02-21: 40 mg via INTRAVENOUS

## 2022-02-21 MED ORDER — ENOXAPARIN SODIUM 40 MG/0.4ML IJ SOSY
40.0000 mg | PREFILLED_SYRINGE | INTRAMUSCULAR | Status: DC
  Administered 2022-02-22 – 2022-02-25 (×4): 40 mg via SUBCUTANEOUS
  Filled 2022-02-21 (×4): qty 0.4

## 2022-02-21 MED ORDER — ACETAMINOPHEN 325 MG PO TABS: 650.0000 mg | ORAL_TABLET | ORAL | Status: DC | PRN

## 2022-02-21 MED ORDER — FLEET ENEMA 7-19 GM/118ML RE ENEM: 1.0000 | ENEMA | Freq: Once | RECTAL | Status: DC | PRN

## 2022-02-21 MED ORDER — SUGAMMADEX SODIUM 200 MG/2ML IV SOLN
INTRAVENOUS | Status: DC | PRN
  Administered 2022-02-21: 200 mg via INTRAVENOUS

## 2022-02-21 MED ORDER — ROCURONIUM BROMIDE 10 MG/ML (PF) SYRINGE
PREFILLED_SYRINGE | INTRAVENOUS | Status: AC
  Filled 2022-02-21: qty 10

## 2022-02-21 MED ORDER — ACETAMINOPHEN 10 MG/ML IV SOLN
INTRAVENOUS | Status: DC | PRN
  Administered 2022-02-21: 1000 mg via INTRAVENOUS

## 2022-02-21 MED ORDER — KETAMINE HCL 50 MG/5ML IJ SOSY
PREFILLED_SYRINGE | INTRAMUSCULAR | Status: AC
  Filled 2022-02-21: qty 5

## 2022-02-21 MED ORDER — METHOCARBAMOL 500 MG PO TABS
500.0000 mg | ORAL_TABLET | Freq: Four times a day (QID) | ORAL | Status: DC | PRN
  Administered 2022-02-21 – 2022-02-25 (×8): 500 mg via ORAL
  Filled 2022-02-21 (×8): qty 1

## 2022-02-21 MED ORDER — MIDAZOLAM HCL 2 MG/2ML IJ SOLN
INTRAMUSCULAR | Status: DC | PRN
  Administered 2022-02-21: 2 mg via INTRAVENOUS

## 2022-02-21 MED ORDER — ACETAMINOPHEN 650 MG RE SUPP: 650.0000 mg | RECTAL | Status: DC | PRN

## 2022-02-21 MED ORDER — ONDANSETRON HCL 4 MG PO TABS: 4.0000 mg | ORAL_TABLET | Freq: Four times a day (QID) | ORAL | Status: DC | PRN

## 2022-02-21 MED ORDER — FENTANYL CITRATE (PF) 250 MCG/5ML IJ SOLN
INTRAMUSCULAR | Status: DC | PRN
  Administered 2022-02-21 (×2): 50 ug via INTRAVENOUS
  Administered 2022-02-21: 100 ug via INTRAVENOUS
  Administered 2022-02-21: 50 ug via INTRAVENOUS

## 2022-02-21 MED ORDER — ALUM & MAG HYDROXIDE-SIMETH 200-200-20 MG/5ML PO SUSP: 30.0000 mL | Freq: Four times a day (QID) | ORAL | Status: DC | PRN

## 2022-02-21 MED ORDER — SENNA 8.6 MG PO TABS
1.0000 | ORAL_TABLET | Freq: Two times a day (BID) | ORAL | Status: DC
  Administered 2022-02-21 – 2022-02-25 (×9): 8.6 mg via ORAL
  Filled 2022-02-21 (×9): qty 1

## 2022-02-21 MED ORDER — POLYETHYLENE GLYCOL 3350 17 G PO PACK: 17.0000 g | PACK | Freq: Every day | ORAL | Status: DC | PRN

## 2022-02-21 SURGICAL SUPPLY — 69 items
ADH SKN CLS APL DERMABOND .7 (GAUZE/BANDAGES/DRESSINGS) ×2
APL SRG 60D 8 XTD TIP BNDBL (TIP)
BAG COUNTER SPONGE SURGICOUNT (BAG) ×2 IMPLANT
BAG SPNG CNTER NS LX DISP (BAG) ×4
BASKET BONE COLLECTION (BASKET) ×2 IMPLANT
BLADE BONE MILL MEDIUM (MISCELLANEOUS) ×2 IMPLANT
BLADE CLIPPER SURG (BLADE) IMPLANT
BUR MATCHSTICK NEURO 3.0 LAGG (BURR) ×2 IMPLANT
CANISTER SUCT 3000ML PPV (MISCELLANEOUS) ×2 IMPLANT
CNTNR URN SCR LID CUP LEK RST (MISCELLANEOUS) ×2 IMPLANT
CONT SPEC 4OZ STRL OR WHT (MISCELLANEOUS) ×2
COVER BACK TABLE 60X90IN (DRAPES) ×2 IMPLANT
DERMABOND ADVANCED .7 DNX12 (GAUZE/BANDAGES/DRESSINGS) ×2 IMPLANT
DEVICE DISSECT PLASMABLAD 3.0S (MISCELLANEOUS) ×2 IMPLANT
DRAPE C-ARM 42X72 X-RAY (DRAPES) ×4 IMPLANT
DRAPE C-ARMOR (DRAPES) IMPLANT
DRAPE HALF SHEET 40X57 (DRAPES) IMPLANT
DRAPE LAPAROTOMY 100X72X124 (DRAPES) ×2 IMPLANT
DRSG OPSITE POSTOP 4X8 (GAUZE/BANDAGES/DRESSINGS) IMPLANT
DURAPREP 26ML APPLICATOR (WOUND CARE) ×2 IMPLANT
DURASEAL APPLICATOR TIP (TIP) IMPLANT
DURASEAL SPINE SEALANT 3ML (MISCELLANEOUS) IMPLANT
ELECT REM PT RETURN 9FT ADLT (ELECTROSURGICAL) ×2
ELECTRODE REM PT RTRN 9FT ADLT (ELECTROSURGICAL) ×2 IMPLANT
GAUZE 4X4 16PLY ~~LOC~~+RFID DBL (SPONGE) IMPLANT
GAUZE SPONGE 4X4 12PLY STRL (GAUZE/BANDAGES/DRESSINGS) ×2 IMPLANT
GLOVE BIO SURGEON STRL SZ7.5 (GLOVE) IMPLANT
GLOVE BIOGEL PI IND STRL 7.5 (GLOVE) IMPLANT
GLOVE BIOGEL PI IND STRL 8.5 (GLOVE) ×4 IMPLANT
GLOVE ECLIPSE 8.5 STRL (GLOVE) ×4 IMPLANT
GOWN STRL REUS W/ TWL LRG LVL3 (GOWN DISPOSABLE) IMPLANT
GOWN STRL REUS W/ TWL XL LVL3 (GOWN DISPOSABLE) IMPLANT
GOWN STRL REUS W/TWL 2XL LVL3 (GOWN DISPOSABLE) ×4 IMPLANT
GOWN STRL REUS W/TWL LRG LVL3 (GOWN DISPOSABLE) ×2
GOWN STRL REUS W/TWL XL LVL3 (GOWN DISPOSABLE) ×4
HEMOSTAT POWDER KIT SURGIFOAM (HEMOSTASIS) ×2 IMPLANT
KIT BASIN OR (CUSTOM PROCEDURE TRAY) ×2 IMPLANT
KIT GRAFTMAG DEL NEURO DISP (NEUROSURGERY SUPPLIES) IMPLANT
KIT TURNOVER KIT B (KITS) ×2 IMPLANT
MILL BONE PREP (MISCELLANEOUS) ×2 IMPLANT
NDL SPNL 18GX3.5 QUINCKE PK (NEEDLE) IMPLANT
NEEDLE HYPO 22GX1.5 SAFETY (NEEDLE) ×2 IMPLANT
NEEDLE SPNL 18GX3.5 QUINCKE PK (NEEDLE) ×2 IMPLANT
NS IRRIG 1000ML POUR BTL (IV SOLUTION) ×2 IMPLANT
PACK LAMINECTOMY NEURO (CUSTOM PROCEDURE TRAY) ×2 IMPLANT
PAD ARMBOARD 7.5X6 YLW CONV (MISCELLANEOUS) ×6 IMPLANT
PATTIES SURGICAL .5 X1 (DISPOSABLE) ×2 IMPLANT
PLASMABLADE 3.0S (MISCELLANEOUS) ×2
ROD RELIN-O LORD 5.5X65MM (Rod) IMPLANT
SCREW LOCK RELINE 5.5 TULIP (Screw) IMPLANT
SCREW RELINE 6.5X35 POLYAXIAL (Screw) IMPLANT
SCREW RELINE-O POLY 6.5X45 (Screw) IMPLANT
SPIKE FLUID TRANSFER (MISCELLANEOUS) ×2 IMPLANT
SPONGE SURGIFOAM ABS GEL 100 (HEMOSTASIS) IMPLANT
SPONGE T-LAP 4X18 ~~LOC~~+RFID (SPONGE) IMPLANT
SUT PROLENE 6 0 BV (SUTURE) IMPLANT
SUT VIC AB 1 CT1 18XBRD ANBCTR (SUTURE) ×2 IMPLANT
SUT VIC AB 1 CT1 8-18 (SUTURE) ×2
SUT VIC AB 2-0 CP2 18 (SUTURE) ×2 IMPLANT
SUT VIC AB 3-0 SH 8-18 (SUTURE) ×2 IMPLANT
SUT VIC AB 4-0 RB1 18 (SUTURE) ×2 IMPLANT
SYR 20ML ECCENTRIC (SYRINGE) IMPLANT
SYR 3ML LL SCALE MARK (SYRINGE) ×8 IMPLANT
SYR 5ML LL (SYRINGE) IMPLANT
TOWEL GREEN STERILE (TOWEL DISPOSABLE) ×2 IMPLANT
TOWEL GREEN STERILE FF (TOWEL DISPOSABLE) ×2 IMPLANT
TRAY FOLEY MTR SLVR 14FR STAT (SET/KITS/TRAYS/PACK) IMPLANT
TRAY FOLEY MTR SLVR 16FR STAT (SET/KITS/TRAYS/PACK) ×2 IMPLANT
WATER STERILE IRR 1000ML POUR (IV SOLUTION) ×2 IMPLANT

## 2022-02-21 NOTE — Anesthesia Postprocedure Evaluation (Signed)
Anesthesia Post Note  Patient: Tyler Thomas  Procedure(s) Performed: Lumbar three to Lumbar five decompression Posterior fixation and open reduction ;harvest bone graft left illiac crest (Back) HARVEST ILIAC BONE GRAFT     Patient location during evaluation: PACU Anesthesia Type: General Level of consciousness: awake and alert Pain management: pain level controlled Vital Signs Assessment: post-procedure vital signs reviewed and stable Respiratory status: spontaneous breathing, nonlabored ventilation and respiratory function stable Cardiovascular status: blood pressure returned to baseline Postop Assessment: no apparent nausea or vomiting Anesthetic complications: no   No notable events documented.  Last Vitals:  Vitals:   02/21/22 1230 02/21/22 1249  BP: (!) 116/90 117/89  Pulse: 97 99  Resp: 11 14  Temp: 37.1 C 37.1 C  SpO2: 95% 92%    Last Pain:  Vitals:   02/21/22 1249  TempSrc: Oral  PainSc: 0-No pain                 Marthenia Rolling

## 2022-02-21 NOTE — Interval H&P Note (Signed)
History and Physical Interval Note:  02/21/2022 7:40 AM  Tyler Thomas  has presented today for surgery, with the diagnosis of L4 Burst Fracture.  The various methods of treatment have been discussed with the patient and family. After consideration of risks, benefits and other options for treatment, the patient has consented to  Procedure(s): L3 to L5 Posterior fixation and open reduction (N/A) as a surgical intervention.  The patient's history has been reviewed, patient examined, no change in status, stable for surgery.  I have reviewed the patient's chart and labs.  Questions were answered to the patient's satisfaction.     Earleen Newport

## 2022-02-21 NOTE — Anesthesia Procedure Notes (Signed)
Procedure Name: Intubation Date/Time: 02/21/2022 7:59 AM  Performed by: Mosetta Pigeon, CRNAPre-anesthesia Checklist: Patient identified, Emergency Drugs available, Suction available and Patient being monitored Patient Re-evaluated:Patient Re-evaluated prior to induction Oxygen Delivery Method: Circle System Utilized Preoxygenation: Pre-oxygenation with 100% oxygen Induction Type: IV induction Ventilation: Mask ventilation without difficulty and Oral airway inserted - appropriate to patient size Laryngoscope Size: Mac and 4 Grade View: Grade I Tube type: Oral Tube size: 7.5 mm Number of attempts: 1 Airway Equipment and Method: Stylet and Oral airway Placement Confirmation: ETT inserted through vocal cords under direct vision, positive ETCO2 and breath sounds checked- equal and bilateral Secured at: 21 cm Tube secured with: Tape Dental Injury: Teeth and Oropharynx as per pre-operative assessment

## 2022-02-21 NOTE — Transfer of Care (Signed)
Immediate Anesthesia Transfer of Care Note  Patient: [name]  Procedure(s) Performed: Lumbar three to Lumbar five decompression Posterior fixation and open reduction ;harvest bone graft left illiac crest (Back)  Patient Location: PACU  Anesthesia Type:General  Level of Consciousness: awake, alert , and oriented  Airway & Oxygen Therapy: Patient Spontanous Breathing and Patient connected to face mask oxygen  Post-op Assessment: Report given to RN and Post -op Vital signs reviewed and stable  Post vital signs: Reviewed and stable  Last Vitals:  Vitals Value Taken Time  BP 122/91 02/21/22 1200  Temp    Pulse 95 02/21/22 1203  Resp 13 02/21/22 1203  SpO2 94 % 02/21/22 1203  Vitals shown include unvalidated device data.  Last Pain:  Vitals:   02/21/22 0309  TempSrc: Oral  PainSc:       Patients Stated Pain Goal: 2 (22/63/33 5456)  Complications: No notable events documented.

## 2022-02-21 NOTE — Op Note (Signed)
Date of surgery: 02/21/2022 Preoperative diagnosis: L4 burst fracture with spinal stenosis Postoperative diagnosis: Same Procedure: Decompression of L4 burst fracture and posterior stabilization L3-L5 with pedicle screw fixation segmentally and posterolateral arthrodesis using local autograft obtained from the posterior superior iliac crest on the left through a separate fascial incision. Surgeon: Kristeen Miss First Assistant: Deatra Ina, MD Anesthesia: General endotracheal Indications: Tyler Thomas is a 42 year old male who sustained a seizure.  In the course of his seizure he developed a burst fracture of the L4 vertebrae with a pincer type configuration.  He had a superior endplate fracture that retropulsed bone into the canal he has congenital spinal stenosis and this made the spinal stenosis at the L3-4 level much more severe.  He had intractable back pain.  He was advised regarding the need for surgical decompression and stabilization.  Procedure the patient was brought to the operating room supine on the stretcher.  After the smooth induction of general tracheal anesthesia, he was carefully turned prone.  The back was prepped with alcohol DuraPrep and draped in a sterile fashion.  A midline incision was created in the lower lumbar spine this was carried down to the lumbodorsal fascia.  The fascia was opened on either side of midline to expose the spinous process of L4 which was verified radiographically.  Dissection was then carried up to L3 to expose the L3-L4 facet joints inferiorly and out to the L3 transverse process.  Dissection exposed then the L for L5 joints all the way out to the transverse process the transverse process of L4 was also decorticated and this was packed away for later use and grafting.  Then a decompression was undertaken by creating bilateral large laminotomies between L3 and L4.  The thickened redundant yellow ligament was taken up in this area.  There is noted to be a  slight compression with a slight twisting of the spinous process offset to the right at L4 behind the L3 spinous process.  This area was cleared in the laminectomy was completed to allow good decompression of the common dural tube and the takeoff of the L4 nerve root inferiorly.  This was checked bilaterally hemostasis in the epidural veins was obtained with some bipolar cautery and pledgets of Gelfoam soaked in thrombin which were later irrigated away once the decompression was obtained attention was turned to the posterior suprailiac crest on the left side.  Then through a separate fascial incision over the crest itself we open the gluteal fascia and dissected the gluteus muscle away from the outer table of the ilium.  The ridge at the posterior suprailiac crest was then opened using a small straight osteotome and strips of cortical cancellous bone were obtained from this region.  The underlying cancellous bone was then harvested using a curved gouge.  When adequate sample of bone was obtained for the fusion hemostasis was achieved by reflecting the muscle back over the bleeding surface of the bone.  The fascia was closed with #1 Vicryl in interrupted fashion.  2-0 Vicryl was then used in the superficial portion of the fascia.  Attention was then turned to the lateral gutters which were then packed away and the cortical cancellous strips were laid in the lateral beds of the intertransverse space between L3 and L5.  At this point pedicle entry sites were chosen using fluoroscopic guidance 6.5 x 45 mm screws were placed in L3 6.5 x 35 mm screws were placed and L4 and 6.5 x 45 mm screws were placed  and L5 these were checked and verified for no evidence of cut out and good positioning in the pedicles and vertebral bodies.  Then precontoured rods were connected between the screw heads and a neutral construct.  This was done with 65 mm precontoured rods.  The system was torqued down and final radiographs were obtained  in AP and lateral projections.  Good alignment of the vertebrae was obtained and when this was verified the remainder of the bone that was harvested from the laminotomies and the posterior suprailiac crest was packed into the lateral gutters.  Hemostasis was carefully obtained in the wound and then the lumbodorsal fascia was closed with #1 Vicryl interrupted fashion 2-0 Vicryl was used in the subcutaneous tissues 3-0 and 4-0 Vicryl in the final subcuticular closure blood loss for the procedure was estimated 700 cc and 315 cc of Cell Saver blood was returned to the patient patient was then returned to recovery room in stable condition.

## 2022-02-21 NOTE — Progress Notes (Signed)
Pt returned to unit from PACU. A&O x 4 denies pain at this time. Assessment and VS documented. Seizure pads on bed noted to be missing except for 1 bedrail. Padding applied with blankets for seizure precautions.

## 2022-02-21 NOTE — Progress Notes (Signed)
Orthopedic Tech Progress Note Patient Details:  Tyler Thomas 09/17/1980 [id number]  Ortho Devices Type of Ortho Device: Lumbar corsett Ortho Device/Splint Location: BACK Ortho Device/Splint Interventions: Ordered   Post Interventions Patient Tolerated: Well Instructions Provided: Care of device  Janit Pagan 02/21/2022, 1:26 PM

## 2022-02-21 NOTE — Progress Notes (Signed)
1730- Pt bed alarm going off upon entering room this RN noted pt rolling in bed and pulling his self up from lying to sitting. Educated pt on back precautions and safety. Pt states he is having 10/10 pain to back and bilat legs. Movement and sensation in extremities remain intact, no changed from prior assessment. PRN percocet and robaxin administered. Will assess pain within 1 hour.   1810- Pt continues to c/o 9/10 pain to back. Ice pack applied. PRN toradol administered. Will assess pain within 1 hour.

## 2022-02-22 LAB — BASIC METABOLIC PANEL
Anion gap: 9 (ref 5–15)
BUN: 15 mg/dL (ref 6–20)
CO2: 22 mmol/L (ref 22–32)
Calcium: 8.3 mg/dL — ABNORMAL LOW (ref 8.9–10.3)
Chloride: 106 mmol/L (ref 98–111)
Creatinine, Ser: 0.97 mg/dL (ref 0.61–1.24)
GFR, Estimated: >60 mL/min (ref >60)
Glucose, Bld: 137 mg/dL — ABNORMAL HIGH (ref 70–99)
Potassium: 4.3 mmol/L (ref 3.5–5.1)
Sodium: 137 mmol/L (ref 135–145)

## 2022-02-22 LAB — CBC
HCT: 38 % — ABNORMAL LOW (ref 39.0–52.0)
Hemoglobin: 13.1 g/dL (ref 13.0–17.0)
MCH: 32.3 pg (ref 26.0–34.0)
MCHC: 34.5 g/dL (ref 30.0–36.0)
MCV: 93.6 fL (ref 80.0–100.0)
Platelets: 205 10*3/uL (ref 150–400)
RBC: 4.06 MIL/uL — ABNORMAL LOW (ref 4.22–5.81)
RDW: 12.3 % (ref 11.5–15.5)
WBC: 16.2 10*3/uL — ABNORMAL HIGH (ref 4.0–10.5)
nRBC: 0 % (ref 0.0–0.2)

## 2022-02-22 MED FILL — Thrombin For Soln 5000 Unit: CUTANEOUS | Qty: 5000 | Status: AC

## 2022-02-22 NOTE — Evaluation (Signed)
Physical Therapy Evaluation Patient Details Name: Tyler Thomas MRN: [id number] DOB: Nov 24, 1980 Today's Date: 02/22/2022  History of Present Illness  42 y.o. male presents to [facility] hospital on 02/20/2024 after seizures and with significant back pain. Pt found to have L4 burst fx. Pt underwent decompression of L4 burst fx and L3-5 posterolateral arthrodesis on 2/6. No PMH on file.  Clinical Impression  Pt presents to PT with deficits in safety awareness, endurance, power, balance. Pt is able to ambulate with use of a RW, demonstrates increased trunk flexion throughout ambulation. Pt does demonstrate difficulty with attempts at lower body dressing and donning brace, requiring PT assistance. Pt will benefit from aggressive mobilization in an effort to improve mobility quality and reduce falls risk. Pt is unable to identify any caregiver support and will need to be at a modI level to return home unless caregiver support is identified. PT recommends HHPT services at this time.       Recommendations for follow up therapy are one component of a multi-disciplinary discharge planning process, led by the attending physician.  Recommendations may be updated based on patient status, additional functional criteria and insurance authorization.  Follow Up Recommendations Home health PT (no caregiver support, HHPT may be beneficial initially if transportation is an issue)      Assistance Recommended at Discharge PRN  Patient can return home with the following  A little help with bathing/dressing/bathroom;Assistance with cooking/housework;Assist for transportation;Help with stairs or ramp for entrance    Equipment Recommendations Rolling walker (2 wheels);BSC/3in1  Recommendations for Other Services       Functional Status Assessment Patient has had a recent decline in their functional status and demonstrates the ability to make significant improvements in function in a reasonable and predictable amount of time.      Precautions / Restrictions Precautions Precautions: Fall;Back Precaution Booklet Issued:  (verbally reviewed back precautions) Required Braces or Orthoses: Spinal Brace Spinal Brace: Lumbar corset;Applied in sitting position Restrictions Weight Bearing Restrictions: No      Mobility  Bed Mobility               General bed mobility comments: pt received sitting in arm chair in room    Transfers Overall transfer level: Needs assistance Equipment used: Rolling walker (2 wheels) Transfers: Sit to/from Stand Sit to Stand: Supervision                Ambulation/Gait Ambulation/Gait assistance: Supervision Gait Distance (Feet): 200 Feet Assistive device: Rolling walker (2 wheels) Gait Pattern/deviations: Step-through pattern, Trunk flexed Gait velocity: functional Gait velocity interpretation: 1.31 - 2.62 ft/sec, indicative of limited community ambulator   General Gait Details: slowed step-through gait, increased trunk flexion, PT encourages increased hip extension  Stairs            Wheelchair Mobility    Modified Rankin (Stroke Patients Only)       Balance Overall balance assessment: Needs assistance Sitting-balance support: Feet supported, No upper extremity supported Sitting balance-Leahy Scale: Fair     Standing balance support: Single extremity supported, Reliant on assistive device for balance Standing balance-Leahy Scale: Poor                               Pertinent Vitals/Pain Pain Assessment Pain Assessment: 0-10 Pain Score: 7  Pain Location: back Pain Descriptors / Indicators: Aching Pain Intervention(s): Monitored during session    Home Living Family/patient expects to be discharged to:: Private residence  Living Arrangements: Alone Available Help at Discharge:  (none identified) Type of Home: House Home Access: Stairs to enter Entrance Stairs-Rails: Can reach both Entrance Stairs-Number of Steps: 3   Home  Layout: One level Home Equipment: None      Prior Function Prior Level of Function : Independent/Modified Independent;Driving;Working/employed             Mobility Comments: works in Personal assistant        Extremity/Trunk Assessment   Upper Extremity Assessment Upper Extremity Assessment: Overall WFL for tasks assessed    Lower Extremity Assessment Lower Extremity Assessment: Overall WFL for tasks assessed    Cervical / Trunk Assessment Cervical / Trunk Assessment: Back Surgery  Communication   Communication: No difficulties  Cognition Arousal/Alertness: Awake/alert Behavior During Therapy: Impulsive Overall Cognitive Status: Impaired/Different from baseline Area of Impairment: Safety/judgement, Awareness                         Safety/Judgement: Decreased awareness of safety Awareness: Emergent            General Comments General comments (skin integrity, edema, etc.): VSS on RA    Exercises     Assessment/Plan    PT Assessment Patient needs continued PT services  PT Problem List Decreased strength;Decreased activity tolerance;Decreased balance;Decreased mobility;Decreased safety awareness;Decreased knowledge of precautions;Pain       PT Treatment Interventions DME instruction;Stair training;Gait training;Functional mobility training;Therapeutic activities;Neuromuscular re-education;Therapeutic exercise;Balance training;Patient/family education    PT Goals (Current goals can be found in the Care Plan section)  Acute Rehab PT Goals Patient Stated Goal: to go outside PT Goal Formulation: With patient Time For Goal Achievement: 03/08/22 Potential to Achieve Goals: Fair    Frequency Min 5X/week     Co-evaluation               AM-PAC PT "6 Clicks" Mobility  Outcome Measure Help needed turning from your back to your side while in a flat bed without using bedrails?: A Little Help needed moving from lying on your back to  sitting on the side of a flat bed without using bedrails?: A Little Help needed moving to and from a bed to a chair (including a wheelchair)?: A Little Help needed standing up from a chair using your arms (e.g., wheelchair or bedside chair)?: A Little Help needed to walk in hospital room?: A Little Help needed climbing 3-5 steps with a railing? : A Lot 6 Click Score: 17    End of Session Equipment Utilized During Treatment: Back brace Activity Tolerance: Patient tolerated treatment well Patient left: in chair;with call bell/phone within reach;with chair alarm set Nurse Communication: Mobility status PT Visit Diagnosis: Other abnormalities of gait and mobility (R26.89);Muscle weakness (generalized) (M62.81);Pain Pain - part of body:  (back)    Time: 4431-5400 PT Time Calculation (min) (ACUTE ONLY): 24 min   Charges:   PT Evaluation $PT Eval Low Complexity: [place], PT, DPT Acute Rehabilitation Office [phone]   Zenaida Niece 02/22/2022, 10:30 AM

## 2022-02-22 NOTE — Addendum Note (Signed)
Addendum  created 02/22/22 1448 by Brennan Bailey, MD   Intraprocedure Staff edited

## 2022-02-22 NOTE — Evaluation (Signed)
Occupational Therapy Evaluation Patient Details Name: Tyler Thomas MRN: [id number] DOB: November 12, 1980 Today's Date: 02/22/2022   History of Present Illness 42 y.o. male presents to [facility] hospital on 02/20/2024 after seizures and with significant back pain. Pt found to have L4 burst fx. Pt underwent decompression of L4 burst fx and L3-5 posterolateral arthrodesis on 2/6. No PMH on file.   Clinical Impression   Patient is s/p lumbar surgery resulting in functional limitations due to the deficits listed below (see OT Problem List). Prior to admit, pt was independent with all ADL tasks, working, and driving while living alone. Currently, pt is limited by post surgical pain causing decreased activity tolerance and endurance requiring increased assistance to complete BADL tasks and functional mobility/transfers. Reviewed back precautions verbally. Recommended pt not wear back brace in the bed. Once removed, pt reported increased comfort. Education provided on positioning for sleep including supine and sidelying. Pain causing increased difficulty with LB ADL. Pt was able to bring each foot up to him in order complete simulated LB dressing.  Patient will benefit from skilled OT to increase their safety and independence with ADL and functional mobility for ADL to allow facilitate discharge to venue listed below.   Patient is confined to a single room and not able to walk the distance required to go the bathroom, or he/she is unable to safely negotiate stairs required to access the bathroom.  A 3in1 BSC will alleviate this problem        Recommendations for follow up therapy are one component of a multi-disciplinary discharge planning process, led by the attending physician.  Recommendations may be updated based on patient status, additional functional criteria and insurance authorization.   Follow Up Recommendations  No OT follow up     Assistance Recommended at Discharge PRN  Patient can return home with the  following A little help with walking and/or transfers;A little help with bathing/dressing/bathroom;Help with stairs or ramp for entrance;Assistance with cooking/housework;Assist for transportation    Functional Status Assessment  Patient has had a recent decline in their functional status and demonstrates the ability to make significant improvements in function in a reasonable and predictable amount of time.  Equipment Recommendations  BSC/3in1       Precautions / Restrictions Precautions Precautions: Fall;Back Precaution Booklet Issued:  (verbally reviewed back precautions) Required Braces or Orthoses: Spinal Brace Spinal Brace: Lumbar corset;Applied in sitting position Restrictions Weight Bearing Restrictions: No      Mobility Bed Mobility Overal bed mobility: Needs Assistance Bed Mobility: Rolling, Sidelying to Sit Rolling: Modified independent (Device/Increase time) Sidelying to sit: Modified independent (Device/Increase time)         Patient Response: Restless  Transfers Overall transfer level:  (pt declined OOB activity)      Balance Overall balance assessment: Needs assistance Sitting-balance support: Feet supported, No upper extremity supported Sitting balance-Leahy Scale: Fair Sitting balance - Comments: sitting EOB       ADL either performed or assessed with clinical judgement   ADL Overall ADL's : Needs assistance/impaired         Upper Body Bathing: Supervision/ safety;Sitting   Lower Body Bathing: Minimal assistance;Sit to/from stand   Upper Body Dressing : Supervision/safety;Sitting   Lower Body Dressing: Minimal assistance;Sit to/from stand   Toilet Transfer: Min guard;BSC/3in1;Rolling walker (2 wheels)   Toileting- Clothing Manipulation and Hygiene: Minimal assistance;Sit to/from stand               Vision Baseline Vision/History: 0 No visual deficits Ability  to See in Adequate Light: 0 Adequate Patient Visual Report: No change  from baseline              Pertinent Vitals/Pain Pain Assessment Pain Assessment: Faces Faces Pain Scale: Hurts worst Pain Location: low back Pain Descriptors / Indicators: Aching, Grimacing, Guarding, Operative site guarding Pain Intervention(s): Limited activity within patient's tolerance, Monitored during session, Repositioned, Ice applied, Patient requesting pain meds-RN notified     Hand Dominance Right   Extremity/Trunk Assessment Upper Extremity Assessment Upper Extremity Assessment: Overall WFL for tasks assessed (limited by pain)   Lower Extremity Assessment Lower Extremity Assessment: Defer to PT evaluation   Cervical / Trunk Assessment Cervical / Trunk Assessment: Back Surgery   Communication Communication Communication: No difficulties   Cognition Arousal/Alertness: Awake/alert Behavior During Therapy: WFL for tasks assessed/performed, Restless Overall Cognitive Status: Impaired/Different from baseline Area of Impairment: Safety/judgement, Awareness         Safety/Judgement: Decreased awareness of safety                      Home Living Family/patient expects to be discharged to:: Private residence Living Arrangements: Alone Available Help at Discharge:  (None identified) Type of Home: House Home Access: Stairs to enter Technical brewer of Steps: 3 Entrance Stairs-Rails: Can reach both Home Layout: One level     Bathroom Shower/Tub: Teacher, early years/pre: Standard     Home Equipment: None          Prior Functioning/Environment Prior Level of Function : Independent/Modified Independent;Driving;Working/employed             Mobility Comments: works in Runner, broadcasting/film/video Problem List: Decreased strength;Pain;Impaired balance (sitting and/or standing);Decreased activity tolerance;Decreased knowledge of precautions      OT Treatment/Interventions: Self-care/ADL training;Therapeutic exercise;Therapeutic  activities;Neuromuscular education;Energy conservation;Cognitive remediation/compensation;DME and/or AE instruction;Patient/family education;Manual therapy;Balance training;Modalities    OT Goals(Current goals can be found in the care plan section) Acute Rehab OT Goals Patient Stated Goal: to decrease pain OT Goal Formulation: With patient Time For Goal Achievement: 03/08/22 Potential to Achieve Goals: Good  OT Frequency: Min 2X/week       AM-PAC OT "6 Clicks" Daily Activity     Outcome Measure Help from another person eating meals?: None Help from another person taking care of personal grooming?: None Help from another person toileting, which includes using toliet, bedpan, or urinal?: A Little Help from another person bathing (including washing, rinsing, drying)?: A Little Help from another person to put on and taking off regular upper body clothing?: A Little Help from another person to put on and taking off regular lower body clothing?: A Little 6 Click Score: 20   End of Session Equipment Utilized During Treatment: Back brace  Activity Tolerance: Patient limited by pain Patient left: in bed;with call bell/phone within reach  OT Visit Diagnosis: Muscle weakness (generalized) (M62.81)                Time: 6144-3154 OT Time Calculation (min): 16 min Charges:  OT General Charges $OT Visit: 1 Visit OT Evaluation $OT Eval Moderate Complexity: 1 Mod  [employer], OTR/L,CBIS  Supplemental OT - [facility] and [facility] Secure Chat Preferred    Deral Schellenberg, Clarene Duke 02/22/2022, 3:59 PM

## 2022-02-22 NOTE — Progress Notes (Signed)
Patient ID: Tyler Thomas, male   DOB: 14-Mar-1980, 42 y.o.   MRN: [id number] Vital signs stable but tachycardic to 120s Labs good, hct 38 Motor function good, some slight bleeding on dressing. Patient states pain is significant Not feeling anxious Lives alone  May need home health at time of discharge Mobilize today

## 2022-02-22 NOTE — Plan of Care (Signed)
  Problem: Pain Management: Goal: Pain level will decrease Outcome: Progressing  Patient administered prn Oxycodone for management of back pain,  reassessed in an hour, relief of pain was reported. Problem: Activity:  Problem: Activity: Goal: Will remain free from falls Outcome: Progressing Patient has demonstrated using call for assistance and has remained free of falls.

## 2022-02-23 ENCOUNTER — Encounter (HOSPITAL_COMMUNITY): Payer: Self-pay | Admitting: Neurological Surgery

## 2022-02-23 MED ORDER — NICOTINE 14 MG/24HR TD PT24
14.0000 mg | MEDICATED_PATCH | Freq: Every day | TRANSDERMAL | Status: DC
  Administered 2022-02-23 – 2022-02-25 (×3): 14 mg via TRANSDERMAL
  Filled 2022-02-23 (×3): qty 1

## 2022-02-23 MED FILL — Heparin Sodium (Porcine) Inj 1000 Unit/ML: INTRAMUSCULAR | Qty: 20 | Status: AC

## 2022-02-23 MED FILL — Sodium Chloride IV Soln 0.9%: INTRAVENOUS | Qty: 2000 | Status: AC

## 2022-02-23 NOTE — Progress Notes (Signed)
Mobility Specialist Progress Note    02/23/22 1452  Mobility  Activity Ambulated with assistance in hallway  Level of Assistance Contact guard assist, steadying assist  Assistive Device Front wheel walker  Distance Ambulated (ft) 400 ft  Activity Response Tolerated well  Mobility Referral Yes  $Mobility charge 1 Mobility   Pt received in chair and agreeable. No complaints on walk. Returned to chair with call bell in reach.   Hildred Alamin Mobility Specialist  Please Psychologist, sport and exercise or Rehab Office at [phone]

## 2022-02-23 NOTE — Progress Notes (Signed)
Physical Therapy Treatment Patient Details Name: Tyler Thomas MRN: [id number] DOB: 1980-06-29 Today's Date: 02/23/2022   History of Present Illness 42 y.o. male presents to [facility] hospital on 02/20/2024 after seizures and with significant back pain. Pt found to have L4 burst fx. Pt underwent decompression of L4 burst fx and L3-5 posterolateral arthrodesis on 2/6. No PMH on file.    PT Comments    Pt required supervision transfers and ambulation 200' + 400' with RW. Seated rest break between gait trials. Cues needed for sequencing and hand placement during transfers. Cues needed for posture and proximity to RW during gait. Reviewed back precautions and brace wear schedule. Pt returned to recliner at end of session.    Recommendations for follow up therapy are one component of a multi-disciplinary discharge planning process, led by the attending physician.  Recommendations may be updated based on patient status, additional functional criteria and insurance authorization.  Follow Up Recommendations  Home health PT     Assistance Recommended at Discharge PRN  Patient can return home with the following A little help with bathing/dressing/bathroom;Assistance with cooking/housework;Assist for transportation;Help with stairs or ramp for entrance   Equipment Recommendations  Rolling walker (2 wheels);BSC/3in1    Recommendations for Other Services       Precautions / Restrictions Precautions Precautions: Fall;Back Precaution Comments: reviewed back precautions Required Braces or Orthoses: Spinal Brace Spinal Brace: Lumbar corset;Applied in sitting position     Mobility  Bed Mobility               General bed mobility comments: Pt up in recliner.    Transfers Overall transfer level: Needs assistance Equipment used: Rolling walker (2 wheels) Transfers: Sit to/from Stand Sit to Stand: Supervision           General transfer comment: cues for sequencing     Ambulation/Gait Ambulation/Gait assistance: Supervision Gait Distance (Feet): 200 Feet (+ 400) Assistive device: Rolling walker (2 wheels) Gait Pattern/deviations: Step-through pattern, Decreased stride length Gait velocity: WFL Gait velocity interpretation: 1.31 - 2.62 ft/sec, indicative of limited community ambulator   General Gait Details: Cues for posture and proximity to RW. Seated rest break between gait trials.   Stairs             Wheelchair Mobility    Modified Rankin (Stroke Patients Only)       Balance Overall balance assessment: Needs assistance Sitting-balance support: Feet supported, No upper extremity supported Sitting balance-Leahy Scale: Good     Standing balance support: Bilateral upper extremity supported, Reliant on assistive device for balance, During functional activity Standing balance-Leahy Scale: Poor                              Cognition Arousal/Alertness: Awake/alert Behavior During Therapy: WFL for tasks assessed/performed, Restless, Impulsive Overall Cognitive Status: Impaired/Different from baseline Area of Impairment: Safety/judgement, Awareness                         Safety/Judgement: Decreased awareness of safety Awareness: Emergent   General Comments: mildly impulsive        Exercises      General Comments General comments (skin integrity, edema, etc.): VSS on RA      Pertinent Vitals/Pain Pain Assessment Pain Assessment: 0-10 Pain Score: 3  Pain Location: low back Pain Descriptors / Indicators: Discomfort Pain Intervention(s): Monitored during session, Repositioned    Home Living  Prior Function            PT Goals (current goals can now be found in the care plan section) Acute Rehab PT Goals Patient Stated Goal: home Progress towards PT goals: Progressing toward goals    Frequency    Min 5X/week      PT Plan Current plan remains  appropriate    Co-evaluation              AM-PAC PT "6 Clicks" Mobility   Outcome Measure  Help needed turning from your back to your side while in a flat bed without using bedrails?: A Little Help needed moving from lying on your back to sitting on the side of a flat bed without using bedrails?: A Little Help needed moving to and from a bed to a chair (including a wheelchair)?: A Little Help needed standing up from a chair using your arms (e.g., wheelchair or bedside chair)?: A Little Help needed to walk in hospital room?: A Little Help needed climbing 3-5 steps with a railing? : A Lot 6 Click Score: 17    End of Session Equipment Utilized During Treatment: Back brace;Gait belt Activity Tolerance: Patient tolerated treatment well Patient left: in chair;with call bell/phone within reach;with chair alarm set Nurse Communication: Mobility status PT Visit Diagnosis: Other abnormalities of gait and mobility (R26.89);Muscle weakness (generalized) (M62.81);Pain     Time: 7106-2694 PT Time Calculation (min) (ACUTE ONLY): 23 min  Charges:  $Gait Training: 23-37 mins                     Gloriann Loan., PT  Office # [phone]    Lorriane Shire 02/23/2022, 12:04 PM

## 2022-02-23 NOTE — TOC Initial Note (Signed)
Transition of Care (TOC) - Initial/Assessment Note  Marvetta Gibbons RN,BSN Transitions of Care Unit 4NP (Non Trauma)- RN Case Manager See Treatment Team for direct Phone #   Patient Details  Name: Tyler Thomas MRN: [id number] Date of Birth: 03/15/1980  Transition of Care [facility]) CM/SW Contact:    Dawayne Patricia, RN Phone Number: 02/23/2022, 2:01 PM  Clinical Narrative:                 Pt seen at bedside to discuss transition needs for home. Pt reports he lives alone, does not have any friends or family to assist. Pt does report that he has a co-worker that is willing to transport him home at discharge, and this co-worker is also bringing pt his phone later today so he can access his emails.   Discussed with pt need for [facility] services however this is not someone that will assist him with everyday home management or ADLs- pt voiced that he had spoken with someone at his insurance and his believes he has coverage for some sort of assistance however he needs to be able to read his emails - as they are to send him info regarding his benefits.  Pt confirmed he will need DME- RW, and wants to think about BSC/3n1-   CM will follow up with pt tomorrow- when hopefully he will have his phone and access to his email to further discuss his transition plan and [place] needs.   Expected Discharge Plan: [place] Barriers to Discharge: Continued Medical Work up   Patient Goals and CMS Choice Patient states their goals for this hospitalization and ongoing recovery are:: return home CMS Medicare.gov Compare Post Acute Care list provided to:: Patient Choice offered to / list presented to : Patient      Expected Discharge Plan and Services   Discharge Planning Services: CM Consult Post Acute Care Choice: Durable Medical Equipment, Home Health Living arrangements for the past 2 months: Apartment                 DME Arranged: Bedside commode, Walker rolling                     Prior Living Arrangements/Services Living arrangements for the past 2 months: Apartment Lives with:: Self Patient language and need for interpreter reviewed:: Yes Do you feel safe going back to the place where you live?: Yes      Need for Family Participation in Patient Care: Yes (Comment)     Criminal Activity/Legal Involvement Pertinent to Current Situation/Hospitalization: No - Comment as needed  Activities of Daily Living      Permission Sought/Granted Permission sought to share information with : Facility [facility] administrator granted to share info w AGENCY: HH/DME        Emotional Assessment Appearance:: Appears stated age Attitude/Demeanor/Rapport: Engaged Affect (typically observed): Accepting, Appropriate Orientation: : Oriented to Self, Oriented to Place, Oriented to  Time, Oriented to Situation Alcohol / Substance Use: Not Applicable Psych Involvement: No (comment)  Admission diagnosis:  Seizure ([place]) [R56.9] Lumbar burst fracture, closed, initial encounter ([place]) [S32.001A] Closed fracture of third lumbar vertebra, unspecified fracture morphology, initial encounter [facility]) [S32.039A] Patient Active Problem List   Diagnosis Date Noted   Lumbar burst fracture, closed, initial encounter ([place]) 02/19/2022   PCP:  Patient, No Pcp Per Pharmacy:   [id number] - , [address]  [place] [address] Phone: [phone] Fax: [phone]     Social Determinants of Health (SDOH) Social History:   SDOH Interventions:     Readmission Risk Interventions     No data to display

## 2022-02-23 NOTE — Progress Notes (Signed)
Patient ID: Tyler Thomas, male   DOB: 31-Jul-1980, 42 y.o.   MRN: [id number] Vital signs are stable Patient feels legs are restless Incision is clean and dry Patient is nearly ready for discharge home He lives alone in an apartment He does not have transportation These needs will have to be fulfilled Advised that he will likely be out of work for at least 8 weeks time Will see him in the office in 2 weeks time for further follow-up

## 2022-02-24 MED ORDER — METHOCARBAMOL 500 MG PO TABS: 500.0000 mg | ORAL_TABLET | Freq: Four times a day (QID) | ORAL | 3 refills | Status: AC | PRN

## 2022-02-24 MED ORDER — CLONAZEPAM 2 MG PO TBDP: 2.0000 mg | ORAL_TABLET | Freq: Two times a day (BID) | ORAL | 0 refills | Status: AC

## 2022-02-24 MED ORDER — OXYCODONE-ACETAMINOPHEN 5-325 MG PO TABS: 1.0000 | ORAL_TABLET | Freq: Four times a day (QID) | ORAL | 0 refills | Status: AC | PRN

## 2022-02-24 NOTE — Progress Notes (Signed)
Physical Therapy Treatment Patient Details Name: Tyler Thomas MRN: [id number] DOB: 1980-10-02 Today's Date: 02/24/2022   History of Present Illness 42 y.o. male presents to [facility] hospital on 02/20/2024 after seizures and with significant back pain. Pt found to have L4 burst fx. Pt underwent decompression of L4 burst fx and L3-5 posterolateral arthrodesis on 2/6. No PMH on file.    PT Comments    Focused session on progressing to stair training in preparation for anticipated d/c home today. Pt can be mildly impulsive, but did slow down and initiate using bil upper extremities for support on the stairs to maintain his safety. Pt was able to navigate x3 stairs at a min guard assist level without LOB. He needs reminders for UE placement with transfers. Will continue to follow acutely. Current recommendations remain appropriate.     Recommendations for follow up therapy are one component of a multi-disciplinary discharge planning process, led by the attending physician.  Recommendations may be updated based on patient status, additional functional criteria and insurance authorization.  Follow Up Recommendations  Home health PT     Assistance Recommended at Discharge PRN  Patient can return home with the following A little help with bathing/dressing/bathroom;Assistance with cooking/housework;Assist for transportation;Help with stairs or ramp for entrance   Equipment Recommendations  Rolling walker (2 wheels);BSC/3in1    Recommendations for Other Services       Precautions / Restrictions Precautions Precautions: Fall;Back Precaution Booklet Issued: Yes (comment) Precaution Comments: reviewed back precautions Required Braces or Orthoses: Spinal Brace Spinal Brace: Lumbar corset;Applied in sitting position Restrictions Weight Bearing Restrictions: No     Mobility  Bed Mobility               General bed mobility comments: Pt up in recliner.    Transfers Overall transfer level:  Needs assistance Equipment used: Rolling walker (2 wheels) Transfers: Sit to/from Stand Sit to Stand: Supervision           General transfer comment: Supervision for safety to come to stand, but pt needing cues to push up from recliner and reach back for recliner to sit as pt tends to keep bil hands on RW for sit <> stand transfers.    Ambulation/Gait Ambulation/Gait assistance: Supervision Gait Distance (Feet): 250 Feet Assistive device: Rolling walker (2 wheels) Gait Pattern/deviations: Step-through pattern, Decreased stride length, Trunk flexed, Narrow base of support Gait velocity: WFL Gait velocity interpretation: 1.31 - 2.62 ft/sec, indicative of limited community ambulator   General Gait Details: Pt cued to remain within and proximal to RW, improve upright posture, and widen BOS. Momentary success noted. No LOB, supervision for safety   Stairs Stairs: Yes Stairs assistance: Min guard Stair Management: One rail Right, Step to pattern, Forwards, Two rails Number of Stairs: 3 General stair comments: Ascends with R rail only (often placing bil hands on rail) and descends with bil rails, step-to pattern, min guard for safety. Extra time and effort descending.   Wheelchair Mobility    Modified Rankin (Stroke Patients Only)       Balance Overall balance assessment: Needs assistance Sitting-balance support: Feet supported, No upper extremity supported Sitting balance-Leahy Scale: Good     Standing balance support: Bilateral upper extremity supported, Single extremity supported, During functional activity, Reliant on assistive device for balance Standing balance-Leahy Scale: Poor Standing balance comment: Reliant on 1 UE support to stand statically, bil UE support to ambulate  Cognition Arousal/Alertness: Awake/alert Behavior During Therapy: WFL for tasks assessed/performed, Restless, Impulsive Overall Cognitive Status:  Impaired/Different from baseline Area of Impairment: Safety/judgement, Awareness                         Safety/Judgement: Decreased awareness of safety Awareness: Emergent   General Comments: mildly impulsive, needs reminders to remain within RW and to maintain spinal precautions        Exercises      General Comments        Pertinent Vitals/Pain Pain Assessment Pain Assessment: Faces Faces Pain Scale: Hurts a little bit Pain Location: low back Pain Descriptors / Indicators: Discomfort Pain Intervention(s): Monitored during session, Limited activity within patient's tolerance, Repositioned    Home Living                          Prior Function            PT Goals (current goals can now be found in the care plan section) Acute Rehab PT Goals Patient Stated Goal: home PT Goal Formulation: With patient Time For Goal Achievement: 03/08/22 Potential to Achieve Goals: Fair Progress towards PT goals: Progressing toward goals    Frequency    Min 5X/week      PT Plan Current plan remains appropriate    Co-evaluation              AM-PAC PT "6 Clicks" Mobility   Outcome Measure  Help needed turning from your back to your side while in a flat bed without using bedrails?: A Little Help needed moving from lying on your back to sitting on the side of a flat bed without using bedrails?: A Little Help needed moving to and from a bed to a chair (including a wheelchair)?: A Little Help needed standing up from a chair using your arms (e.g., wheelchair or bedside chair)?: A Little Help needed to walk in hospital room?: A Little Help needed climbing 3-5 steps with a railing? : A Little 6 Click Score: 18    End of Session Equipment Utilized During Treatment: Gait belt;Back brace Activity Tolerance: Patient tolerated treatment well Patient left: in chair;with call bell/phone within reach;with chair alarm set   PT Visit Diagnosis: Other  abnormalities of gait and mobility (R26.89);Muscle weakness (generalized) (M62.81);Pain;Unsteadiness on feet (R26.81) Pain - part of body:  (back)     Time: [id number] PT Time Calculation (min) (ACUTE ONLY): 9 min  Charges:  $Gait Training: 8-22 mins                     Moishe Spice, PT, DPT Acute Rehabilitation Services  Office: [place] 02/24/2022, 11:45 AM

## 2022-02-24 NOTE — TOC Progression Note (Signed)
Transition of Care (TOC) - Progression Note  Marvetta Gibbons RN,BSN Transitions of Care Unit 4NP (Non Trauma)- RN Case Manager See Treatment Team for direct Phone #   Patient Details  Name: Tyler Thomas MRN: [id number] Date of Birth: [birth year]  Transition of Care [facility]) CM/SW Contact  Dahlia Client Romeo Rabon, RN Phone Number: 02/24/2022, 1:06 PM  Clinical Narrative:    Follow up done with pt at bedside- per pt he is still speaking with his insurance and will continue to follow up with them regarding coverage and options for assistance.  Pt is agreeable to referral to [facility] under his BCBS plan, and also has decided he needs both RW and BSC for home- no preference on providers for either [facility] or DME.   Address, phone # confirmed, pt voiced he does not have a PCP- and agreeable to having a f/u appointment made for primary care.   Call made to [facility] w/ [employer] for DME needs- RW and BSC to be delivered to room prior to discharge.   Call made to Amy with [facility]- for [facility] needs- however they do not have a contract w/ BCBS at this time- unable to service.  Call made to [facility]- they can accept insurance- however pt will have copay cost- Calvin to call and speak with pt to see if pt can afford copay cost for [facility] visits.   1230- received return call from [place] at [facility]- after speaking w/ pt- pt voiced that he would not be able to afford the copay for [facility] visits ($100+ per visit)- Pt would have copay with any [place] discussed option for outpt therapy which would be cheaper however pt has transportation barriers- pt could try to see if his insurance has any transportation benefits that might assist- will check to see if MD agreeable to outpt PT as well.  Pt declined [facility] services at this time due to cost.      Expected Discharge Plan: [place] Barriers to Discharge: Barriers Resolved  Expected Discharge Plan and Services   Discharge Planning Services: CM  Consult Post Acute Care Choice: Durable Medical Equipment, Home Health Living arrangements for the past 2 months: Apartment                 DME Arranged: Bedside commode, Walker rolling DME Agency: [employer] Date DME Agency Contacted: 02/24/22 Time DME Agency Contacted: 53 Representative spoke with at DME Agency: Brenton Grills HH Arranged: PT [place]: [facility] Date [place]: 02/24/22 Time [place]: 72 Representative spoke with at [place]: [place] ([place]) Interventions    Readmission Risk Interventions    02/24/2022   11:59 AM  Readmission Risk Prevention Plan  Post Dischage Appt Complete  Medication Screening Complete  Transportation Screening Complete

## 2022-02-24 NOTE — Progress Notes (Signed)
Pt has discharge order but pt can't get hold of his friend to come pick him up. Pt work Freight forwarder who was visiting says pt don't have any family here and he's the person trying to be nice and help pt when he can but he can't commit to take pt home. Case manager Almyra Free notified and per Almyra Free, pt should wait until he can get hold of his friend to be discharged. Dr. Ellene Route notified and he said pt can be discharge when he's able to reach his friend/ride. Will continue to monitor pt. Pt DME walker delivered to pt at bedside. Delia Heady RN

## 2022-02-24 NOTE — Discharge Summary (Signed)
Physician Discharge Summary  Patient ID: Tyler Thomas MRN: [id number] DOB/AGE: 02-20-1980 42 y.o.  Admit date: 02/19/2022 Discharge date: 02/24/2022  Admission Diagnoses: [place] mal seizure.  L4 burst fracture.  Lumbar stenosis.  Discharge Diagnoses: Grand mal seizure.  L4 burst fracture.  Lumbar stenosis. Principal Problem:   Lumbar burst fracture, closed, initial encounter [facility])   Discharged Condition: good  Hospital Course: Patient was admitted after suffering a grand mal seizure secondary to withdrawal from Klonopin.  He had been on this medication prescribed by his psychiatrist and ran out and 48 hours later experienced a grand mal seizure while sitting in the shower.  He sustained an L4 burst fracture with substantial stenosis in the lumbar spine.  He was not able to be mobilized and required surgical decompression and stabilization from L3-L5.  After this she was able to be mobilized and is discharged home now.  He has been restarted on his Klonopin.  He is given a prescription from this at the hospital but will require further refills per his psychiatrist.  Consults: neurology  Significant Diagnostic Studies: None  Treatments: surgery: See op note  Discharge Exam: Blood pressure 123/87, pulse 78, temperature 98.6 F (37 C), temperature source Oral, resp. rate 16, height 6' (1.829 m), weight 87.2 kg, SpO2 99 %. Incision is clean and dry [place] and gait are intact.  Disposition: Discharge disposition: 01-Home or Self Care       Discharge Instructions     Ambulatory referral to Neurology   Complete by: As directed    An appointment is requested in approximately: 4 weeks   Call MD for:  redness, tenderness, or signs of infection (pain, swelling, redness, odor or green/yellow discharge around incision site)   Complete by: As directed    Call MD for:  severe uncontrolled pain   Complete by: As directed    Call MD for:  temperature >100.4   Complete by: As directed     Diet - low sodium heart healthy   Complete by: As directed    Discharge instructions   Complete by: As directed    Okay to shower. Do not apply salves or appointments to incision. No heavy lifting with the upper extremities greater than 10 pounds. .   Incentive spirometry RT   Complete by: As directed    Increase activity slowly   Complete by: As directed       Allergies as of 02/24/2022   No Known Allergies      Medication List     STOP taking these medications    clonazePAM 2 MG tablet Commonly known as: KLONOPIN Replaced by: clonazepam 2 MG disintegrating tablet       TAKE these medications    amphetamine-dextroamphetamine 20 MG tablet Commonly known as: ADDERALL Take 20 mg by mouth daily.   clonazepam 2 MG disintegrating tablet Commonly known as: KLONOPIN Take 1 tablet (2 mg total) by mouth 2 (two) times daily. Replaces: clonazePAM 2 MG tablet   methocarbamol 500 MG tablet Commonly known as: ROBAXIN Take 1 tablet (500 mg total) by mouth every 6 (six) hours as needed for muscle spasms.   oxyCODONE-acetaminophen 5-325 MG tablet Commonly known as: PERCOCET/ROXICET Take 1-2 tablets by mouth every 6 (six) hours as needed for moderate pain or severe pain.   QUEtiapine 200 MG tablet Commonly known as: SEROQUEL Take 50-100 mg by mouth at bedtime as needed (sleep).               Durable  Medical Equipment  (From admission, onward)           Start     Ordered   02/23/22 1226  DME 3-in-1  (Discharge Planning)  Once        02/23/22 1225   02/23/22 1225  DME Walker rolling  (Discharge Planning)  Once       Question Answer Comment  Walker: With [place]   Patient needs a walker to treat with the following condition Lumbar burst fracture, closed, initial encounter ([place])      02/23/22 1225   02/23/22 1221  For home use only DME Walker rolling  Once       Question Answer Comment  Walker: With [place]   Patient needs a walker to treat with the  following condition Weakness generalized      02/23/22 1220   02/22/22 1448  For home use only DME 3 n 1  Once       Comments: Patient is confined to a single room and not able to walk the distance required to go the bathroom, or he/she is unable to safely negotiate stairs required to access the bathroom.  A 3in1 BSC will alleviate this problem   02/22/22 1447            Follow-up Information     [employer] Follow up.   Why: Rolling walker and BSC/3n1 arranged- to be delivered to room prior to discharge Contact information: [address]  [address] [place] [address] [phone]        Kerin Perna, NP. Go on 03/09/2022.   Specialty: Internal Medicine Why: TIME : 2:30 PM DATE : [facility] follow up and to establish primary care Contact information: [place]  [id number] [phone]                 Signed: Earleen Newport 02/24/2022, 3:55 PM

## 2022-02-24 NOTE — Progress Notes (Signed)
Occupational Therapy Treatment Patient Details Name: Tyler Thomas MRN: [id number] DOB: Feb 10, 1980 Today's Date: 02/24/2022   History of present illness 42 y.o. male presents to [facility] hospital on 02/20/2024 after seizures and with significant back pain. Pt found to have L4 burst fx. Pt underwent decompression of L4 burst fx and L3-5 posterolateral arthrodesis on 2/6. No PMH on file.   OT comments  Pt currently supervision for selfcare tasks sit to stand following back precautions.  Slight increased back pain with mobility in the room and LSO in place, but pt able to tolerate.  Provided education on AE and DME use for selfcare tasks with handout review on back precautions.  Pt will need 3:1 for home for safe completion of toilet transfers.  Discussed need for a shower seat or small seat to sit on for bathing.  He will check into using something he already has access or check with friends to see if they have something he can use.  Recommend continued acute care OT at this time while in acute care.     Recommendations for follow up therapy are one component of a multi-disciplinary discharge planning process, led by the attending physician.  Recommendations may be updated based on patient status, additional functional criteria and insurance authorization.    Follow Up Recommendations  No OT follow up     Assistance Recommended at Discharge PRN  Patient can return home with the following  A little help with bathing/dressing/bathroom;Help with stairs or ramp for entrance;Assistance with cooking/housework;Assist for transportation   Equipment Recommendations  BSC/3in1       Precautions / Restrictions Precautions Precautions: Fall;Back Precaution Comments: reviewed back precautions Required Braces or Orthoses: Spinal Brace Spinal Brace: Lumbar corset;Applied in sitting position Restrictions Weight Bearing Restrictions: No       Mobility Bed Mobility Overal bed mobility: Needs Assistance    Rolling: Modified independent (Device/Increase time) Sidelying to sit: Supervision (HOB flat but pt used rail)            Transfers Overall transfer level: Needs assistance Equipment used: Rolling walker (2 wheels) Transfers: Sit to/from Stand, Bed to chair/wheelchair/BSC Sit to Stand: Supervision     Step pivot transfers: Supervision     General transfer comment: Min instructional cueing for hand placement with sit to stand.     Balance Overall balance assessment: Needs assistance Sitting-balance support: Feet supported, No upper extremity supported Sitting balance-Leahy Scale: Good Sitting balance - Comments: No LOB when sitting EOB.   Standing balance support: Bilateral upper extremity supported, Reliant on assistive device for balance, During functional activity Standing balance-Leahy Scale: Poor Standing balance comment: Pt needs BUE support in standing.                           ADL either performed or assessed with clinical judgement   ADL Overall ADL's : Needs assistance/impaired                 Upper Body Dressing : Supervision/safety;Sitting Upper Body Dressing Details (indicate cue type and reason): donned TLSO in sitting Lower Body Dressing: Supervision/safety;Sit to/from stand Lower Body Dressing Details (indicate cue type and reason): simulated following back precautions Toilet Transfer: Supervision/safety;Ambulation;Rolling walker (2 wheels);BSC/3in1 Toilet Transfer Details (indicate cue type and reason): simulated Toileting- Clothing Manipulation and Hygiene: Supervision/safety;Sit to/from stand Toileting - Clothing Manipulation Details (indicate cue type and reason): simulated Tub/ Shower Transfer: Min guard;Ambulation;Rolling walker (2 wheels) Tub/Shower Transfer Details (indicate cue type and  reason): simulated with trash can Functional mobility during ADLs: Supervision/safety;Rolling walker (2 wheels) General ADL Comments: Provided  education on back precautions during selfcare and functional tasks.  Pt able to state 2/3 precautions but needed cueing for avoiding lifting.  Discussed need for a shower seat for safety as pt is not able to stand in the shower without support at this time.  Provided instruction on trying to see if someone has one he can use or a small chair that will fit in the shower, as he does not have enough money for one.  Encouraged purchase of a reacher as he lives alone and will need to be able to occasionally retrieve items from the floor if dropped.  Provided handout with visual reference for purchasing items from [place] if he chooses.      Cognition Arousal/Alertness: Awake/alert Behavior During Therapy: WFL for tasks assessed/performed, Restless, Impulsive Overall Cognitive Status: Impaired/Different from baseline Area of Impairment: Safety/judgement                         Safety/Judgement: Decreased awareness of safety Awareness: Emergent   General Comments: Min instructional cueing for hand placement with sit to stand.  He continues to want to pull up on the walker with sit to stand.                   Pertinent Vitals/ Pain       Pain Assessment Pain Assessment: Faces Faces Pain Scale: Hurts a little bit Pain Location: low back Pain Descriptors / Indicators: Discomfort Pain Intervention(s): Limited activity within patient's tolerance, RN gave pain meds during session         Frequency  Min 2X/week        Progress Toward Goals  OT Goals(current goals can now be found in the care plan section)  Progress towards OT goals: Progressing toward goals  Acute Rehab OT Goals Patient Stated Goal: To hopefully go home today OT Goal Formulation: With patient Time For Goal Achievement: 03/08/22 Potential to Achieve Goals: Good  Plan Discharge plan remains appropriate       AM-PAC OT "6 Clicks" Daily Activity     Outcome Measure   Help from another person eating  meals?: None Help from another person taking care of personal grooming?: None Help from another person toileting, which includes using toliet, bedpan, or urinal?: A Little Help from another person bathing (including washing, rinsing, drying)?: A Little Help from another person to put on and taking off regular upper body clothing?: None Help from another person to put on and taking off regular lower body clothing?: A Little 6 Click Score: 21    End of Session Equipment Utilized During Treatment: Back brace  OT Visit Diagnosis: Muscle weakness (generalized) (M62.81);Pain Pain - part of body:  (back)   Activity Tolerance Patient tolerated treatment well   Patient Left with call bell/phone within reach;in chair   Nurse Communication Mobility status        Time: [id number] OT Time Calculation (min): 47 min  Charges: OT General Charges $OT Visit: 1 Visit OT Treatments $Self Care/Home Management : 38-52 mins  Clyda Greener, OTR/L [place]  Office [phone] 02/24/2022

## 2022-02-25 NOTE — Progress Notes (Signed)
Pt has found someone that can come pick him up to transport home safely. Pt discharge education and instructions completed. Pt voices understanding and denies any questions. Pt instructed to follow up with PCP and doctor Elsner. Pt's back incision remains unremarkable and back brace is on aligned. Pt IV and telemetry removed. Pt to pick up electronically sent prescriptions from preferred pharmacy on file. Pt requested for a nicotine patch and NP Meyran was notified who said pt's PCP will have to order the nicotine patch. Pt informed and agree to notify PCP for the patch. Pt waiting on his ride to come pick him up to disposition. Delia Heady RN

## 2022-02-25 NOTE — Progress Notes (Signed)
RNCM contacted [employer] regarding DME-insurance will not cover 3N1 for patient-he is aware.

## 2022-02-25 NOTE — Progress Notes (Signed)
Occupational Therapy Treatment Patient Details Name: Tyler Thomas MRN: [id number] DOB: 1980/06/30 Today's Date: 02/25/2022   History of present illness 42 y.o. male presents to [facility] hospital on 02/20/2024 after seizures and with significant back pain. Pt found to have L4 burst fx. Pt underwent decompression of L4 burst fx and L3-5 posterolateral arthrodesis on 2/6. No PMH on file.   OT comments  Pt. Progressing well with acute OT goals.  Able to recall back precautions and not break them during adls.  Demonstrated good tech. For lb dressing and simulated toileting tasks.  Good log roll for back to bed also.  Reviewed bathing options if no DME available once home.  Eager for d/c once transportation arrangements in place.     Recommendations for follow up therapy are one component of a multi-disciplinary discharge planning process, led by the attending physician.  Recommendations may be updated based on patient status, additional functional criteria and insurance authorization.    Follow Up Recommendations  No OT follow up     Assistance Recommended at Discharge PRN  Patient can return home with the following  A little help with bathing/dressing/bathroom;Help with stairs or ramp for entrance;Assistance with cooking/housework;Assist for transportation   Equipment Recommendations  BSC/3in1    Recommendations for Other Services      Precautions / Restrictions Precautions Precautions: Fall;Back Precaution Booklet Issued: Yes (comment) Precaution Comments: reviewed back precautions Required Braces or Orthoses: Spinal Brace Spinal Brace: Lumbar corset;Applied in sitting position Restrictions Weight Bearing Restrictions: No       Mobility Bed Mobility Overal bed mobility: Modified Independent Bed Mobility: Rolling, Sit to Sidelying Rolling: Modified independent (Device/Increase time)       Sit to sidelying: Modified independent (Device/Increase time) General bed mobility comments:  hob flat no rail able to return demo of log roll into bed without rail and mainting precautions    Transfers Overall transfer level: Modified independent Equipment used: Rolling walker (2 wheels) Transfers: Sit to/from Stand Sit to Stand: Supervision           General transfer comment: encouraged pushing from surface pt. declined and felt he had to pull on rw to get into standing     Balance                                           ADL either performed or assessed with clinical judgement   ADL Overall ADL's : Needs assistance/impaired                     Lower Body Dressing: Supervision/safety;Sit to/from stand;Sitting/lateral leans         Toileting - Clothing Manipulation Details (indicate cue type and reason): simulated in standing, pt. able to reach buttocks with L hand in simulation to determine effectiveness without breaking precautions   Tub/Shower Transfer Details (indicate cue type and reason): reviewed sponge bathing if unable to get shower chair. reviewed not to use a reg. chair in his home for the tub/shower area        Extremity/Trunk Assessment              Vision       Perception     Praxis      Cognition Arousal/Alertness: Awake/alert Behavior During Therapy: Impulsive Overall Cognitive Status: Impaired/Different from baseline Area of Impairment: Safety/judgement, Awareness  Safety/Judgement: Decreased awareness of safety Awareness: Emergent   General Comments: mildly impulsive, needs reminders to remain within RW and to maintain spinal precautions. likely at baseline        Exercises      Shoulder Instructions       General Comments      Pertinent Vitals/ Pain       Pain Assessment Pain Assessment: Faces Faces Pain Scale: Hurts a little bit Pain Location: low back Pain Descriptors / Indicators: Discomfort Pain Intervention(s): Limited activity within patient's  tolerance, Monitored during session, Repositioned  Home Living                                          Prior Functioning/Environment              Frequency  Min 2X/week        Progress Toward Goals  OT Goals(current goals can now be found in the care plan section)  Progress towards OT goals: Progressing toward goals     Plan Discharge plan remains appropriate    Co-evaluation                 AM-PAC OT "6 Clicks" Daily Activity     Outcome Measure   Help from another person eating meals?: None Help from another person taking care of personal grooming?: None Help from another person toileting, which includes using toliet, bedpan, or urinal?: A Little Help from another person bathing (including washing, rinsing, drying)?: A Little Help from another person to put on and taking off regular upper body clothing?: None Help from another person to put on and taking off regular lower body clothing?: A Little 6 Click Score: 21    End of Session Equipment Utilized During Treatment: Back brace  OT Visit Diagnosis: Muscle weakness (generalized) (M62.81);Pain   Activity Tolerance Patient tolerated treatment well   Patient Left in bed;with call bell/phone within reach   Nurse Communication Other (comment) (reviewed with rn how pt. had performed during session)        Time: [id number] OT Time Calculation (min): 13 min  Charges: OT General Charges $OT Visit: 1 Visit OT Treatments $Self Care/Home Management : 8-22 mins  Sonia Baller, COTA/L Acute Rehabilitation [phone]   Clearnce Sorrel Lorraine-COTA/L 02/25/2022, 1:03 PM

## 2022-02-25 NOTE — Progress Notes (Signed)
OT had recommended 3 in 1 commode for pt and pt wanted one to take home but per Case manager Terri, pt's insurance will not cover and will cost pt forty dollars to pay out of pocket or may get one cheaper from [employer] or [employer]. Pt elected to get it outside on his own. Pt discharged home and pre medicated with his prn pain meds prior to transporting off unit. Pt transported off unit via wheelchair with belongings to the side including back brace which was on and DME walker. Pt picked up by his friend to transport him home. Delia Heady RN

## 2022-02-25 NOTE — Progress Notes (Signed)
Physical Therapy Treatment and Discharge Patient Details Name: Tyler Thomas MRN: [id number] DOB: January 28, 1980 Today's Date: 02/25/2022   History of Present Illness 42 y.o. male presents to [facility] hospital on 02/20/2024 after seizures and with significant back pain. Pt found to have L4 burst fx. Pt underwent decompression of L4 burst fx and L3-5 posterolateral arthrodesis on 2/6. No PMH on file.    PT Comments    Pt is overall mobilizing well with seemingly fair pain control. He continues to demonstrate impulsivity, which may be his baseline. Pt ambulating 200 ft with a walker and negotiated a flight of stairs without physical assist. Reviewed brace use and spinal precautions. No further acute PT needs. Thank you for this consult.    Recommendations for follow up therapy are one component of a multi-disciplinary discharge planning process, led by the attending physician.  Recommendations may be updated based on patient status, additional functional criteria and insurance authorization.  Follow Up Recommendations  Outpatient PT     Assistance Recommended at Discharge PRN  Patient can return home with the following A little help with bathing/dressing/bathroom;Assistance with cooking/housework;Assist for transportation;Help with stairs or ramp for entrance   Equipment Recommendations  Rolling walker (2 wheels);BSC/3in1    Recommendations for Other Services       Precautions / Restrictions Precautions Precautions: Fall;Back Precaution Booklet Issued: Yes (comment) Precaution Comments: reviewed back precautions Required Braces or Orthoses: Spinal Brace Spinal Brace: Lumbar corset;Applied in sitting position Restrictions Weight Bearing Restrictions: No     Mobility  Bed Mobility Overal bed mobility: Modified Independent             General bed mobility comments: Use of rail    Transfers Overall transfer level: Modified independent Equipment used: Rolling walker (2 wheels)                     Ambulation/Gait Ambulation/Gait assistance: Modified independent (Device/Increase time) Gait Distance (Feet): 200 Feet Assistive device: Rolling walker (2 wheels) Gait Pattern/deviations: Step-through pattern, Decreased stride length, Trunk flexed, Narrow base of support Gait velocity: WFL     General Gait Details: Cues for upright posture and glute activation, lightly utilzing RW   Stairs   Stairs assistance: Supervision Stair Management: Step to pattern, Forwards, One rail Left, Alternating pattern, Sideways Number of Stairs: 20 General stair comments: Step by step initially, progressing to step over step with bilateral hand support.   Wheelchair Mobility    Modified Rankin (Stroke Patients Only)       Balance Overall balance assessment: Needs assistance Sitting-balance support: Feet supported, No upper extremity supported Sitting balance-Leahy Scale: Good     Standing balance support: No upper extremity supported, During functional activity Standing balance-Leahy Scale: Fair                              Cognition Arousal/Alertness: Awake/alert Behavior During Therapy: Impulsive Overall Cognitive Status: Impaired/Different from baseline Area of Impairment: Safety/judgement, Awareness                         Safety/Judgement: Decreased awareness of safety Awareness: Emergent   General Comments: mildly impulsive, needs reminders to remain within RW and to maintain spinal precautions. likely at baseline        Exercises      General Comments        Pertinent Vitals/Pain Pain Assessment Pain Assessment: Faces Faces Pain Scale: Hurts a  little bit Pain Location: low back Pain Descriptors / Indicators: Discomfort Pain Intervention(s): Monitored during session    Home Living                          Prior Function            PT Goals (current goals can now be found in the care plan section)  Acute Rehab PT Goals PT Goal Formulation: With patient Time For Goal Achievement: 03/08/22 Potential to Achieve Goals: Good Progress towards PT goals: Progressing toward goals;Goals met/education completed, patient discharged from PT    Frequency    Min 5X/week      PT Plan Discharge plan needs to be updated;Other (comment) (d/c acute PT)    Co-evaluation              AM-PAC PT "6 Clicks" Mobility   Outcome Measure  Help needed turning from your back to your side while in a flat bed without using bedrails?: None Help needed moving from lying on your back to sitting on the side of a flat bed without using bedrails?: None Help needed moving to and from a bed to a chair (including a wheelchair)?: None Help needed standing up from a chair using your arms (e.g., wheelchair or bedside chair)?: None Help needed to walk in hospital room?: None Help needed climbing 3-5 steps with a railing? : A Little 6 Click Score: 23    End of Session Equipment Utilized During Treatment: Gait belt;Back brace Activity Tolerance: Patient tolerated treatment well Patient left: with call bell/phone within reach;in bed Nurse Communication: Mobility status PT Visit Diagnosis: Other abnormalities of gait and mobility (R26.89);Muscle weakness (generalized) (M62.81);Pain;Unsteadiness on feet (R26.81) Pain - part of body:  (back)     Time: [id number] PT Time Calculation (min) (ACUTE ONLY): 15 min  Charges:  $Gait Training: 8-22 mins                     Wyona Almas, PT, DPT Acute Rehabilitation Services Office [phone]    Deno Etienne 02/25/2022, 11:27 AM

## 2022-03-02 ENCOUNTER — Ambulatory Visit: Payer: BC Managed Care – PPO | Admitting: Neurology

## 2022-03-09 ENCOUNTER — Ambulatory Visit (INDEPENDENT_AMBULATORY_CARE_PROVIDER_SITE_OTHER): Payer: BC Managed Care – PPO | Admitting: Primary Care

## 2022-03-09 ENCOUNTER — Encounter (INDEPENDENT_AMBULATORY_CARE_PROVIDER_SITE_OTHER): Payer: Self-pay | Admitting: Primary Care

## 2022-03-09 VITALS — BP 126/79 | HR 119 | Resp 16 | Ht 71.0 in | Wt 189.8 lb

## 2022-03-09 DIAGNOSIS — Z09 Encounter for follow-up examination after completed treatment for conditions other than malignant neoplasm: Secondary | ICD-10-CM

## 2022-03-09 DIAGNOSIS — F4323 Adjustment disorder with mixed anxiety and depressed mood: Secondary | ICD-10-CM

## 2022-03-09 DIAGNOSIS — Z7689 Persons encountering health services in other specified circumstances: Secondary | ICD-10-CM | POA: Diagnosis not present

## 2022-03-09 NOTE — Patient Instructions (Addendum)
Follow up with [employer]; Rolling walker and BSC/3n1 arranged- to be delivered to room prior to discharge Go to Kerin Perna, NP (Internal Medicine) on 03/09/2022; TIME : 2:30 PM DATE : [facility] follow up and to establish primary care- completed    Schedule an appointment with Kristeen Miss, MD (Neurosurgery)  [facility] [address] [place], [place], [place] [address] [phone]

## 2022-03-09 NOTE — Progress Notes (Signed)
[facility]   Subjective:  Mr. Tyler Thomas is a 42 y.o. male presents for hospital follow up and establish care. He  has a history of seizures in the past. Prescribed  Klonopin 2 mg twice a day and he had ran out of the medication the last 24 hours he had no refills. He was in the shower and had sat down as he felt unusual and he noticed his right arm started to twitch and flail uncontrollably. He then does not remember anything. Today he stills does not remember hospitalization. He presented for severe back pain a CT scan demonstrates the presence of an L4 compression fracture that appears to have a burst pattern with superior propulse fragment. The patient has a congenital spinal stenosis and with the retropulsed fragment he has severe stenosis at the L4-L5 level. Admit date to the hospital was 02/19/22, patient was discharged from the hospital on 02/25/22, patient was admitted for: Lumbar burst fracture. C/O sacrum pain and LE weakness or pain . States only able to move with back brace. 7/10 pain. Missed schedule appointment with Kristeen Miss, MD (Neurosurgery) requesting stiches to be remove - advised to f/u with neurosurgery.  Discharge Orders  No past medical history on file.   No Known Allergies    Current Outpatient Medications on File Prior to Visit  Medication Sig Dispense Refill   amphetamine-dextroamphetamine (ADDERALL) 20 MG tablet Take 20 mg by mouth daily.     clonazePAM (KLONOPIN) 2 MG disintegrating tablet Take 1 tablet (2 mg total) by mouth 2 (two) times daily. 60 tablet 0   methocarbamol (ROBAXIN) 500 MG tablet Take 1 tablet (500 mg total) by mouth every 6 (six) hours as needed for muscle spasms. 30 tablet 3   oxyCODONE-acetaminophen (PERCOCET/ROXICET) 5-325 MG tablet Take 1-2 tablets by mouth every 6 (six) hours as needed for moderate pain or severe pain. 40 tablet 0   QUEtiapine (SEROQUEL) 200 MG tablet Take 50-100 mg by mouth at bedtime as needed (sleep).      No current facility-administered medications on file prior to visit.     Review of System: Objective:  Blood Pressure 126/79   Pulse (Abnormal) 119   Respiration 16   Height 5' 11"$  (1.803 m)   Weight 189 lb 12.8 oz (86.1 kg)   Oxygen Saturation 95%   Body Mass Index 26.47 kg/m   Filed Weights   03/09/22 1401  Weight: 189 lb 12.8 oz (86.1 kg)   Physical Exam: General Appearance: Well nourished, in mild  distress. Eyes: PERRLA, EOMs, conjunctiva no swelling or erythema Sinuses: No Frontal/maxillary tenderness ENT/Mouth: Ext aud canals clear, TMs without erythema, bulging. No erythema, swelling, or exudate on post pharynx.  Tonsils not swollen or erythematous. Hearing normal.  Neck: Supple, thyroid normal.  Respiratory: Respiratory effort normal, BS equal bilaterally without rales, rhonchi, wheezing or stridor.  Cardio: RRR with no MRGs. Brisk peripheral pulses without edema.  Abdomen: Soft, + BS.  Non tender, no guarding, rebound, hernias, masses. Lymphatics: Non tender without lymphadenopathy.  Musculoskeletal:back brace pain and entire leg weakness hurts. Surgical scar no stitches seen glue remnants  Skin: Warm, dry without rashes, lesions, ecchymosis.  Neuro: Cranial nerves intact. Normal muscle tone, no cerebellar symptoms.  Psych: Awake and oriented X 3, normal affect, Insight and Judgment appropriate.    Assessment:  Mankirat was seen today for hospitalization follow-up.  Diagnoses and all orders for this visit: Encounter to establish care   Hospital discharge follow-up Follow up with  [employer]; Rolling walker and BSC/3n1 arranged- to be delivered to room prior to discharge Go to Kerin Perna, NP (Internal Medicine) on 03/09/2022; TIME : 2:30 PM DATE : FEBRUARY 22 ,2024 Hospital follow up and to establish primary care Schedule an appointment with Kristeen Miss, MD (Neurosurgery)- missed appt  Adjustment disorder with mixed anxiety and depressed mood Flowsheet  Row Office Visit from 03/09/2022 in [place]  PHQ-9 Total Score 14      Refer to CSW   This note has been created with Surveyor, quantity. Any transcriptional errors are unintentional.   Kerin Perna, NP 03/09/2022, 2:18 PM

## 2022-04-11 ENCOUNTER — Telehealth (INDEPENDENT_AMBULATORY_CARE_PROVIDER_SITE_OTHER): Payer: Self-pay | Admitting: Licensed Clinical Social Worker

## 2022-04-11 NOTE — Telephone Encounter (Signed)
LCSWA called patient today to introduce herself and to assess patients' mental health needs. Patient did not answer the phone. LCSWA was able to leave a brief message with the patient asking them to return the call. Patient was referred by PCP for anxiety and depression.   

## 2022-07-04 NOTE — Therapy (Signed)
OUTPATIENT PHYSICAL THERAPY THORACOLUMBAR EVALUATION   Patient Name: Tyler MCCUTCHAN MRN: [id number] DOB:11-29-1980, 42 y.o., male Today's Date: 07/07/2022  END OF SESSION:  PT End of Session - 07/07/22 1016     Visit Number 1    Number of Visits 13    Date for PT Re-Evaluation 09/01/22    Authorization Type [employer] M[employer]    PT Start Time 0915    PT Stop Time 1000    PT Time Calculation (min) 45 min    Activity Tolerance Patient tolerated treatment well    Behavior During Therapy [facility] for tasks assessed/performed             History reviewed. No pertinent past medical history. Past Surgical History:  Procedure Laterality Date   HARVEST BONE GRAFT  02/21/2022   Procedure: HARVEST ILIAC BONE GRAFT;  Surgeon: Barnett Abu, MD;  Location: [facility] OR;  Service: Neurosurgery;;   Patient Active Problem List   Diagnosis Date Noted   Lumbar burst fracture, closed, initial encounter (HCC) 02/19/2022    PCP: Patient, No Pcp Per   REFERRING PROVIDER: Barnett Abu, MD  REFERRING DIAG: S32.040A (ICD-10-CM) - Wedge compression fracture of fourth lumbar vertebra (HCC)  Rationale for Evaluation and Treatment: Rehabilitation  THERAPY DIAG:  Other low back pain  Orthopedic aftercare for healing pathologic vertebral fracture  Muscle weakness (generalized)  ONSET DATE: 02/21/22 DOS  SUBJECTIVE:                                                                                                                                                                                           SUBJECTIVE STATEMENT: Sustained a burst fx from seizure, underwent fusion on 02/21/22, 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  Reports seizures resultant to medication and has self DC'ed seizure inducing anxiety meds.  PERTINENT HISTORY:    PAIN:  Are you having pain? Yes: NPRS scale:  7/10 Pain location: low back Pain description: ache  Aggravating factors: position changes, prolonged activities Relieving factors: position changes  PRECAUTIONS: Back  WEIGHT BEARING RESTRICTIONS: No  FALLS:  Has patient fallen in last 6 months? No   OCCUPATION: not working  PLOF: Independent  PATIENT GOALS: To manage and reduce my symptoms  NEXT MD VISIT: PRN  OBJECTIVE:   DIAGNOSTIC FINDINGS:  IMPRESSION: 1. Acute superior endplate fracture at L3 with loss of height of less than 10%. No retropulsed bone. 2. Acute burst compression fracture of the L4 vertebral body. Posterior bowing of the posterosuperior margin  of the vertebral body by distance of 4 mm. This indents the thecal sac slightly and causes mild narrowing of the lateral recesses but no visible neural compression. No traumatic disc herniation. 3. Nondisplaced fracture of the left transverse process of L4, better shown by CT. 4. Retroperitoneal edema in the region of the fractures. No large hematoma.     Electronically Signed   By: Paulina Fusi M.D.   On: 02/20/2022 17:51  PATIENT SURVEYS:  FOTO 49(60 predicted)  SCREENING FOR RED FLAGS: Bowel or bladder incontinence: No  MUSCLE LENGTH: Hamstrings: Right 90 deg; Left 90 deg Thomas test: negative B  POSTURE: decreased lumbar lordosis  PALPATION: Taught lumbar paraspinals   LUMBAR ROM:   AROM eval  Flexion 50%  Extension 25%  Right lateral flexion 50%  Left lateral flexion 50%  Right rotation   Left rotation    (Blank rows = not tested)  LOWER EXTREMITY ROM:   WNL throughout  Active  Right eval Left eval  Hip flexion    Hip extension    Hip abduction    Hip adduction    Hip internal rotation    Hip external rotation    Knee flexion    Knee extension    Ankle dorsiflexion    Ankle plantarflexion    Ankle inversion    Ankle eversion     (Blank rows = not tested)  LOWER EXTREMITY MMT:    MMT Right eval Left eval  Hip  flexion 4 4  Hip extension 4 4  Hip abduction 4 4  Hip adduction    Hip internal rotation    Hip external rotation    Knee flexion 4 4  Knee extension 4 4  Ankle dorsiflexion    Ankle plantarflexion 4 4  Ankle inversion    Ankle eversion    Core/abdominal 3 3   (Blank rows = not tested)  LUMBAR SPECIAL TESTS:  Straight leg raise test: Negative, Slump test: Negative, and Thomas test: Negative  FUNCTIONAL TESTS:  5 times sit to stand: 16s arms crossed  GAIT: Distance walked: 75ftx2 Assistive device utilized: None Level of assistance: Complete Independence Comments: decreased trunk rotation  TODAY'S TREATMENT:                                                                                                                              DATE: 07/07/22 Eval    PATIENT EDUCATION:  Education details: Discussed eval findings, rehab rationale and POC and patient is in agreement  Person educated: Patient Education method: Explanation Education comprehension: verbalized understanding and needs further education  HOME EXERCISE PROGRAM: Access Code: [email or web address] URL: [email or web address] Date: 07/07/2022 Prepared by: Gustavus Bryant  Exercises - Supine Quadratus Lumborum Stretch  - 2 x daily - 5 x weekly - 1 sets - 2 reps - 30s hold - Supine March with Posterior Pelvic Tilt  - 2 x daily - 5 x weekly - 2 sets - 10  reps - 3s hold - Hooklying Single Knee to Chest Stretch  - 2 x daily - 5 x weekly - 1 sets - 2 reps - 30s hold - Static Prone on Elbows  - 2 x daily - 5 x weekly - 1 sets - 1 reps - 2 min hold  ASSESSMENT:  CLINICAL IMPRESSION: Patient is a 42 y.o. male who was seen today for physical therapy evaluation and treatment for low back pain following surgical correction of burst fx.  Patient reports moderate discomfort in low back and apprehension regarding movement.  He denies radicular symptoms.  5x STS time shows mild deficit, BLE ROM and flexibility are good.  Core  and trunk strength deficits noted as well as soft tissue and myofascial restrictions throughout lumbosacral fascia identified.   OBJECTIVE IMPAIRMENTS: Abnormal gait, decreased activity tolerance, decreased endurance, decreased knowledge of condition, decreased mobility, decreased ROM, decreased strength, increased fascial restrictions, impaired flexibility, postural dysfunction, and pain.   ACTIVITY LIMITATIONS: carrying, lifting, bending, sitting, standing, and squatting  PERSONAL FACTORS: Age, Fitness, and Time since onset of injury/illness/exacerbation are also affecting patient's functional outcome.   REHAB POTENTIAL: Good  CLINICAL DECISION MAKING: Stable/uncomplicated  EVALUATION COMPLEXITY: Low   GOALS: Goals reviewed with patient? No  SHORT TERM GOALS: Target date: 07/28/2022  Patient to demonstrate independence in HEP  Baseline: [email or web address] Goal status: INITIAL  2.  Decrease 5x STS time to <15s arms crossed Baseline: 16s arms crossed Goal status: INITIAL   LONG TERM GOALS: Target date: 08/18/2022  Increase FOTO score to 60 Baseline: 49 Goal status: INITIAL  2.  Increase core/abdominal strength to 4/5 Baseline: 3/5 Goal status: INITIAL  3.  Increase BLE strength to 4+/5 Baseline:  MMT Right eval Left eval  Hip flexion 4 4  Hip extension 4 4  Hip abduction 4 4  Hip adduction    Hip internal rotation    Hip external rotation    Knee flexion 4 4  Knee extension 4 4  Ankle dorsiflexion    Ankle plantarflexion 4 4   Goal status: INITIAL  4.  Decrease worst pain/discomfort to 4/10 Baseline: 7/10 Goal status: INITIAL  5.  Increase trunk ROM to 50% extension, 75% remaining motions Baseline:  AROM eval  Flexion 50%  Extension 25%  Right lateral flexion 50%  Left lateral flexion 50%   Goal status: INITIAL   PLAN:  PT FREQUENCY: 1-2x/week  PT DURATION: 6 weeks  PLANNED INTERVENTIONS: Therapeutic exercises, Therapeutic activity, Neuromuscular  re-education, Balance training, Gait training, Patient/Family education, Self Care, Joint mobilization, Spinal mobilization, Cryotherapy, Moist heat, Manual therapy, and Re-evaluation.  PLAN FOR NEXT SESSION: HEP review and update, manual techniques as appropriate, aerobic tasks, ROM and flexibility activities, strengthening and PREs, TPDN, gait and balance training as needed   Check all possible CPT codes: 82956 - PT Re-evaluation, 97110- Therapeutic Exercise, [phone]- Neuro Re-education, [phone] - Gait Training, [phone] - Manual Therapy, 97530 - Therapeutic Activities, and [phone] - Self Care    Check all conditions that are expected to impact treatment: {Conditions expected to impact treatment:Contractures, spasticity or fracture relevant to requested treatment   If treatment provided at initial evaluation, no treatment charged due to lack of authorization.       Hildred Laser, PT 07/07/2022, 10:18 AM

## 2022-07-07 ENCOUNTER — Ambulatory Visit: Payer: Medicaid Other | Attending: Neurological Surgery

## 2022-07-07 DIAGNOSIS — M5459 Other low back pain: Secondary | ICD-10-CM | POA: Insufficient documentation

## 2022-07-07 DIAGNOSIS — M6281 Muscle weakness (generalized): Secondary | ICD-10-CM | POA: Diagnosis present

## 2022-07-07 DIAGNOSIS — M8448XD Pathological fracture, other site, subsequent encounter for fracture with routine healing: Secondary | ICD-10-CM | POA: Diagnosis present

## 2022-07-13 ENCOUNTER — Ambulatory Visit: Payer: Medicaid Other

## 2022-07-13 DIAGNOSIS — M5459 Other low back pain: Secondary | ICD-10-CM | POA: Diagnosis not present

## 2022-07-13 DIAGNOSIS — M6281 Muscle weakness (generalized): Secondary | ICD-10-CM

## 2022-07-13 DIAGNOSIS — M8448XD Pathological fracture, other site, subsequent encounter for fracture with routine healing: Secondary | ICD-10-CM

## 2022-07-13 NOTE — Therapy (Signed)
OUTPATIENT PHYSICAL THERAPY TREATMENT NOTE   Patient Name: Tyler Thomas MRN: [id number] DOB:1980-06-23, 42 y.o., male Today's Date: 07/14/2022  END OF SESSION:  PT End of Session - 07/14/22 0803     Visit Number 3    Number of Visits 13    Date for PT Re-Evaluation 09/01/22    Authorization Type [place] MCD    Authorization Time Period 12 visits approved 07/10/22-09/08/22    Authorization - Number of Visits 12    PT Start Time 0805    PT Stop Time 0845    PT Time Calculation (min) 40 min    Activity Tolerance Patient tolerated treatment well    Behavior During Therapy [facility] for tasks assessed/performed             History reviewed. No pertinent past medical history. Past Surgical History:  Procedure Laterality Date   HARVEST BONE GRAFT  02/21/2022   Procedure: HARVEST ILIAC BONE GRAFT;  Surgeon: Barnett Abu, MD;  Location: [facility] OR;  Service: Neurosurgery;;   Patient Active Problem List   Diagnosis Date Noted   Lumbar burst fracture, closed, initial encounter (HCC) 02/19/2022    PCP: Patient, No Pcp Per   REFERRING PROVIDER: Barnett Abu, MD  REFERRING DIAG: S32.040A (ICD-10-CM) - Wedge compression fracture of fourth lumbar vertebra (HCC)  Rationale for Evaluation and Treatment: Rehabilitation  THERAPY DIAG:  Other low back pain  Orthopedic aftercare for healing pathologic vertebral fracture  Muscle weakness (generalized)  ONSET DATE: 02/21/22 DOS  SUBJECTIVE:                                                                                                                                                                                           SUBJECTIVE STATEMENT: Better overall but still notes mid back symptoms when transitioning from sit/stand, 7/10 at worst and lasting for several minutes.  PERTINENT HISTORY:    PAIN:  Are you having pain? Yes: NPRS scale: 0-7/10 Pain location: low back Pain description: ache  Aggravating factors: position changes, prolonged  activities Relieving factors: position changes  PRECAUTIONS: Back  WEIGHT BEARING RESTRICTIONS: No  FALLS:  Has patient fallen in last 6 months? No   OCCUPATION: not working  PLOF: Independent  PATIENT GOALS: To manage and reduce my symptoms  NEXT MD VISIT: PRN  OBJECTIVE:   DIAGNOSTIC FINDINGS:  IMPRESSION: 1. Acute superior endplate fracture at L3 with loss of height of less than 10%. No retropulsed bone. 2. Acute burst compression fracture of the L4 vertebral body. Posterior bowing of the posterosuperior margin of the vertebral body by distance of 4 mm. This indents the thecal sac slightly  and causes mild narrowing of the lateral recesses but no visible neural compression. No traumatic disc herniation. 3. Nondisplaced fracture of the left transverse process of L4, better shown by CT. 4. Retroperitoneal edema in the region of the fractures. No large hematoma.     Electronically Signed   By: Paulina Fusi M.D.   On: 02/20/2022 17:51  PATIENT SURVEYS:  FOTO 49(60 predicted)  SCREENING FOR RED FLAGS: Bowel or bladder incontinence: No  MUSCLE LENGTH: Hamstrings: Right 90 deg; Left 90 deg Thomas test: negative B  POSTURE: decreased lumbar lordosis  PALPATION: Taught lumbar paraspinals   LUMBAR ROM:   AROM eval  Flexion 50%  Extension 25%  Right lateral flexion 50%  Left lateral flexion 50%  Right rotation   Left rotation    (Blank rows = not tested)  LOWER EXTREMITY ROM:   WNL throughout  Active  Right eval Left eval  Hip flexion    Hip extension    Hip abduction    Hip adduction    Hip internal rotation    Hip external rotation    Knee flexion    Knee extension    Ankle dorsiflexion    Ankle plantarflexion    Ankle inversion    Ankle eversion     (Blank rows = not tested)  LOWER EXTREMITY MMT:    MMT Right eval Left eval  Hip flexion 4 4  Hip extension 4 4  Hip abduction 4 4  Hip adduction    Hip internal rotation    Hip  external rotation    Knee flexion 4 4  Knee extension 4 4  Ankle dorsiflexion    Ankle plantarflexion 4 4  Ankle inversion    Ankle eversion    Core/abdominal 3 3   (Blank rows = not tested)  LUMBAR SPECIAL TESTS:  Straight leg raise test: Negative, Slump test: Negative, and Thomas test: Negative  FUNCTIONAL TESTS:  5 times sit to stand: 16s arms crossed  GAIT: Distance walked: 76ftx2 Assistive device utilized: None Level of assistance: Complete Independence Comments: decreased trunk rotation  TODAY'S TREATMENT:             [facility] Adult PT Treatment:                                                DATE: 07/14/22 Therapeutic Exercise: Nustep L4 8 min Seated hamstring stretch 30s x2 Supine QL stretch 30s x2 Curl up L2 15x Ppt 3s 5x as w/u PPT with alt. March 10/10 Bridge 15x Hip flexor stretch 30s x2 B Plank on knees 30s x2 Bird dog 10/10    [facility] Adult PT Treatment:                                                DATE: 07/13/22 Therapeutic Exercise: Nustep level 5 x 5 mins while gathering subjective info Runners step up 8" x10 BIL Standing hip abduction/extension RTB at ankles 2x10 each BIL SLR with contralateral pilates ring push 2x10 LTR x10 BIL SKTC x30" BIL DKTC x30"  Prone on elbows x2' STS hands on knees x5, arms extended x3 Deadlifting progression no weight to 10# KB - cues for pacing and form  DATE: 07/07/22 Eval    PATIENT EDUCATION:  Education details: Discussed eval findings, rehab rationale and POC and patient is in agreement  Person educated: Patient Education method: Explanation Education comprehension: verbalized understanding and needs further education  HOME EXERCISE PROGRAM: Access Code: [email or web address] URL: [email or web address] Date: 07/07/2022 Prepared by: Gustavus Bryant  Exercises - Supine Quadratus Lumborum Stretch  - 2 x  daily - 5 x weekly - 1 sets - 2 reps - 30s hold - Supine March with Posterior Pelvic Tilt  - 2 x daily - 5 x weekly - 2 sets - 10 reps - 3s hold - Hooklying Single Knee to Chest Stretch  - 2 x daily - 5 x weekly - 1 sets - 2 reps - 30s hold - Static Prone on Elbows  - 2 x daily - 5 x weekly - 1 sets - 1 reps - 2 min hold  ASSESSMENT:  CLINICAL IMPRESSION: Focus of today was stretching and core work assisting patient with strengthening and activating and engaging core.  Stretching targets region of symptoms.  Advanced resistance and difficulty as noted.  Core weakness evident with new and more challenging tasks.  HEP updated to reflect progress as well as continued deficits.  Patient presents to first follow up PT session reporting continued lower back pain especially with bending and prolonged sitting and walking. He reports HEP compliance. Session today focused on proximal hip and core strengthening. With STS he tends to compensate with hands on knees and little forward trunk lean. Encouraged "nose over toes" with patient able to perform with cueing. Patient continues to benefit from skilled PT services and should be progressed as able to improve functional independence.   OBJECTIVE IMPAIRMENTS: Abnormal gait, decreased activity tolerance, decreased endurance, decreased knowledge of condition, decreased mobility, decreased ROM, decreased strength, increased fascial restrictions, impaired flexibility, postural dysfunction, and pain.   ACTIVITY LIMITATIONS: carrying, lifting, bending, sitting, standing, and squatting  PERSONAL FACTORS: Age, Fitness, and Time since onset of injury/illness/exacerbation are also affecting patient's functional outcome.   REHAB POTENTIAL: Good  CLINICAL DECISION MAKING: Stable/uncomplicated  EVALUATION COMPLEXITY: Low   GOALS: Goals reviewed with patient? No  SHORT TERM GOALS: Target date: 07/28/2022  Patient to demonstrate independence in HEP  Baseline:  [email or web address] Goal status: INITIAL  2.  Decrease 5x STS time to <15s arms crossed Baseline: 16s arms crossed Goal status: INITIAL   LONG TERM GOALS: Target date: 08/18/2022  Increase FOTO score to 60 Baseline: 49 Goal status: INITIAL  2.  Increase core/abdominal strength to 4/5 Baseline: 3/5 Goal status: INITIAL  3.  Increase BLE strength to 4+/5 Baseline:  MMT Right eval Left eval  Hip flexion 4 4  Hip extension 4 4  Hip abduction 4 4  Hip adduction    Hip internal rotation    Hip external rotation    Knee flexion 4 4  Knee extension 4 4  Ankle dorsiflexion    Ankle plantarflexion 4 4   Goal status: INITIAL  4.  Decrease worst pain/discomfort to 4/10 Baseline: 7/10 Goal status: INITIAL  5.  Increase trunk ROM to 50% extension, 75% remaining motions Baseline:  AROM eval  Flexion 50%  Extension 25%  Right lateral flexion 50%  Left lateral flexion 50%   Goal status: INITIAL   PLAN:  PT FREQUENCY: 1-2x/week  PT DURATION: 6 weeks  PLANNED INTERVENTIONS: Therapeutic exercises, Therapeutic activity, Neuromuscular re-education, Balance training, Gait training, Patient/Family education, Self Care, Joint mobilization, Spinal mobilization, Cryotherapy, Moist heat, Manual therapy, and  Re-evaluation.  PLAN FOR NEXT SESSION: HEP review and update, manual techniques as appropriate, aerobic tasks, ROM and flexibility activities, strengthening and PREs, TPDN, gait and balance training as needed   Check all possible CPT codes: 60454 - PT Re-evaluation, 97110- Therapeutic Exercise, [phone]- Neuro Re-education, [phone] - Gait Training, [phone] - Manual Therapy, 97530 - Therapeutic Activities, and [phone] - Self Care    Check all conditions that are expected to impact treatment: {Conditions expected to impact treatment:Contractures, spasticity or fracture relevant to requested treatment   If treatment provided at initial evaluation, no treatment charged due to lack of authorization.        Hildred Laser, PT 07/14/2022, 8:52 AM

## 2022-07-13 NOTE — Therapy (Signed)
OUTPATIENT PHYSICAL THERAPY TREATMENT NOTE   Patient Name: Tyler Thomas MRN: [id number] DOB:12/31/1980, 42 y.o., male Today's Date: 07/13/2022  END OF SESSION:  PT End of Session - 07/13/22 0912     Visit Number 2    Number of Visits 13    Date for PT Re-Evaluation 09/01/22    Authorization Type [place] [employer]    Authorization Time Period 12 visits approved 07/10/22-09/08/22    Authorization - Visit Number 1    Authorization - Number of Visits 12    PT Start Time 0915    PT Stop Time 0955    PT Time Calculation (min) 40 min    Activity Tolerance Patient tolerated treatment well    Behavior During Therapy [facility] for tasks assessed/performed             History reviewed. No pertinent past medical history. Past Surgical History:  Procedure Laterality Date   HARVEST BONE GRAFT  02/21/2022   Procedure: HARVEST ILIAC BONE GRAFT;  Surgeon: Barnett Abu, MD;  Location: [facility] OR;  Service: Neurosurgery;;   Patient Active Problem List   Diagnosis Date Noted   Lumbar burst fracture, closed, initial encounter (HCC) 02/19/2022    PCP: Patient, No Pcp Per   REFERRING PROVIDER: Barnett Abu, MD  REFERRING DIAG: S32.040A (ICD-10-CM) - Wedge compression fracture of fourth lumbar vertebra (HCC)  Rationale for Evaluation and Treatment: Rehabilitation  THERAPY DIAG:  Other low back pain  Orthopedic aftercare for healing pathologic vertebral fracture  Muscle weakness (generalized)  ONSET DATE: 02/21/22 DOS  SUBJECTIVE:                                                                                                                                                                                           SUBJECTIVE STATEMENT: Patient reports that his pain continues when goes from sitting to standing or when he walks for a prolonged period of time.   PERTINENT HISTORY:    PAIN:  Are you having pain? Yes: NPRS scale: 0-7/10 Pain location: low back Pain description: ache  Aggravating  factors: position changes, prolonged activities Relieving factors: position changes  PRECAUTIONS: Back  WEIGHT BEARING RESTRICTIONS: No  FALLS:  Has patient fallen in last 6 months? No   OCCUPATION: not working  PLOF: Independent  PATIENT GOALS: To manage and reduce my symptoms  NEXT MD VISIT: PRN  OBJECTIVE:   DIAGNOSTIC FINDINGS:  IMPRESSION: 1. Acute superior endplate fracture at L3 with loss of height of less than 10%. No retropulsed bone. 2. Acute burst compression fracture of the L4 vertebral body. Posterior bowing of the posterosuperior margin of the vertebral body  by distance of 4 mm. This indents the thecal sac slightly and causes mild narrowing of the lateral recesses but no visible neural compression. No traumatic disc herniation. 3. Nondisplaced fracture of the left transverse process of L4, better shown by CT. 4. Retroperitoneal edema in the region of the fractures. No large hematoma.     Electronically Signed   By: Paulina Fusi M.D.   On: 02/20/2022 17:51  PATIENT SURVEYS:  FOTO 49(60 predicted)  SCREENING FOR RED FLAGS: Bowel or bladder incontinence: No  MUSCLE LENGTH: Hamstrings: Right 90 deg; Left 90 deg Thomas test: negative B  POSTURE: decreased lumbar lordosis  PALPATION: Taught lumbar paraspinals   LUMBAR ROM:   AROM eval  Flexion 50%  Extension 25%  Right lateral flexion 50%  Left lateral flexion 50%  Right rotation   Left rotation    (Blank rows = not tested)  LOWER EXTREMITY ROM:   WNL throughout  Active  Right eval Left eval  Hip flexion    Hip extension    Hip abduction    Hip adduction    Hip internal rotation    Hip external rotation    Knee flexion    Knee extension    Ankle dorsiflexion    Ankle plantarflexion    Ankle inversion    Ankle eversion     (Blank rows = not tested)  LOWER EXTREMITY MMT:    MMT Right eval Left eval  Hip flexion 4 4  Hip extension 4 4  Hip abduction 4 4  Hip adduction     Hip internal rotation    Hip external rotation    Knee flexion 4 4  Knee extension 4 4  Ankle dorsiflexion    Ankle plantarflexion 4 4  Ankle inversion    Ankle eversion    Core/abdominal 3 3   (Blank rows = not tested)  LUMBAR SPECIAL TESTS:  Straight leg raise test: Negative, Slump test: Negative, and Thomas test: Negative  FUNCTIONAL TESTS:  5 times sit to stand: 16s arms crossed  GAIT: Distance walked: 15ftx2 Assistive device utilized: None Level of assistance: Complete Independence Comments: decreased trunk rotation  TODAY'S TREATMENT:                  [facility] Adult PT Treatment:                                                DATE: 07/13/22 Therapeutic Exercise: Nustep level 5 x 5 mins while gathering subjective info Runners step up 8" x10 BIL Standing hip abduction/extension RTB at ankles 2x10 each BIL SLR with contralateral pilates ring push 2x10 LTR x10 BIL SKTC x30" BIL DKTC x30"  Prone on elbows x2' STS hands on knees x5, arms extended x3 Deadlifting progression no weight to 10# KB - cues for pacing and form  DATE: 07/07/22 Eval    PATIENT EDUCATION:  Education details: Discussed eval findings, rehab rationale and POC and patient is in agreement  Person educated: Patient Education method: Explanation Education comprehension: verbalized understanding and needs further education  HOME EXERCISE PROGRAM: Access Code: [email or web address] URL: [email or web address] Date: 07/07/2022 Prepared by: Gustavus Bryant  Exercises - Supine Quadratus Lumborum Stretch  - 2 x daily - 5 x weekly - 1 sets - 2 reps - 30s hold - Supine March with Posterior Pelvic Tilt  - 2 x daily - 5 x weekly - 2 sets - 10 reps - 3s hold - Hooklying Single Knee to Chest Stretch  - 2 x daily - 5 x weekly - 1 sets - 2 reps - 30s hold - Static Prone on Elbows  - 2 x daily - 5 x weekly  - 1 sets - 1 reps - 2 min hold  ASSESSMENT:  CLINICAL IMPRESSION: Patient presents to first follow up PT session reporting continued lower back pain especially with bending and prolonged sitting and walking. He reports HEP compliance. Session today focused on proximal hip and core strengthening. With STS he tends to compensate with hands on knees and little forward trunk lean. Encouraged "nose over toes" with patient able to perform with cueing. Patient continues to benefit from skilled PT services and should be progressed as able to improve functional independence.   OBJECTIVE IMPAIRMENTS: Abnormal gait, decreased activity tolerance, decreased endurance, decreased knowledge of condition, decreased mobility, decreased ROM, decreased strength, increased fascial restrictions, impaired flexibility, postural dysfunction, and pain.   ACTIVITY LIMITATIONS: carrying, lifting, bending, sitting, standing, and squatting  PERSONAL FACTORS: Age, Fitness, and Time since onset of injury/illness/exacerbation are also affecting patient's functional outcome.   REHAB POTENTIAL: Good  CLINICAL DECISION MAKING: Stable/uncomplicated  EVALUATION COMPLEXITY: Low   GOALS: Goals reviewed with patient? No  SHORT TERM GOALS: Target date: 07/28/2022  Patient to demonstrate independence in HEP  Baseline: [email or web address] Goal status: INITIAL  2.  Decrease 5x STS time to <15s arms crossed Baseline: 16s arms crossed Goal status: INITIAL   LONG TERM GOALS: Target date: 08/18/2022  Increase FOTO score to 60 Baseline: 49 Goal status: INITIAL  2.  Increase core/abdominal strength to 4/5 Baseline: 3/5 Goal status: INITIAL  3.  Increase BLE strength to 4+/5 Baseline:  MMT Right eval Left eval  Hip flexion 4 4  Hip extension 4 4  Hip abduction 4 4  Hip adduction    Hip internal rotation    Hip external rotation    Knee flexion 4 4  Knee extension 4 4  Ankle dorsiflexion    Ankle plantarflexion 4 4   Goal  status: INITIAL  4.  Decrease worst pain/discomfort to 4/10 Baseline: 7/10 Goal status: INITIAL  5.  Increase trunk ROM to 50% extension, 75% remaining motions Baseline:  AROM eval  Flexion 50%  Extension 25%  Right lateral flexion 50%  Left lateral flexion 50%   Goal status: INITIAL   PLAN:  PT FREQUENCY: 1-2x/week  PT DURATION: 6 weeks  PLANNED INTERVENTIONS: Therapeutic exercises, Therapeutic activity, Neuromuscular re-education, Balance training, Gait training, Patient/Family education, Self Care, Joint mobilization, Spinal mobilization, Cryotherapy, Moist heat, Manual therapy, and Re-evaluation.  PLAN FOR NEXT SESSION: HEP review and update, manual techniques as appropriate, aerobic tasks, ROM and flexibility activities, strengthening and PREs, TPDN, gait and balance training as needed   Check all possible CPT codes: 95621 - PT Re-evaluation, 97110- Therapeutic Exercise, [phone]- Neuro Re-education, [phone] - Gait Training, [phone] -  Manual Therapy, 97530 - Therapeutic Activities, and 40981 - Self Care    Check all conditions that are expected to impact treatment: {Conditions expected to impact treatment:Contractures, spasticity or fracture relevant to requested treatment   If treatment provided at initial evaluation, no treatment charged due to lack of authorization.       Berta Minor, PTA 07/13/2022, 9:56 AM

## 2022-07-14 ENCOUNTER — Ambulatory Visit: Payer: Medicaid Other

## 2022-07-14 DIAGNOSIS — M8448XD Pathological fracture, other site, subsequent encounter for fracture with routine healing: Secondary | ICD-10-CM

## 2022-07-14 DIAGNOSIS — M6281 Muscle weakness (generalized): Secondary | ICD-10-CM

## 2022-07-14 DIAGNOSIS — M5459 Other low back pain: Secondary | ICD-10-CM

## 2022-07-18 ENCOUNTER — Ambulatory Visit: Payer: Medicaid Other | Attending: Neurological Surgery

## 2022-07-18 DIAGNOSIS — M8448XD Pathological fracture, other site, subsequent encounter for fracture with routine healing: Secondary | ICD-10-CM | POA: Insufficient documentation

## 2022-07-18 DIAGNOSIS — M5459 Other low back pain: Secondary | ICD-10-CM | POA: Insufficient documentation

## 2022-07-18 DIAGNOSIS — M6281 Muscle weakness (generalized): Secondary | ICD-10-CM | POA: Insufficient documentation

## 2022-07-18 NOTE — Therapy (Signed)
OUTPATIENT PHYSICAL THERAPY TREATMENT NOTE   Patient Name: Tyler Thomas MRN: [id number] DOB:Nov 29, 1980, 42 y.o., male Today's Date: 07/21/2022  END OF SESSION:  PT End of Session - 07/21/22 0850     Visit Number 4    Number of Visits 13    Date for PT Re-Evaluation 09/01/22    Authorization Type [place] [facility]    Authorization Time Period 12 visits approved 07/10/22-09/08/22    Authorization - Number of Visits 12    PT Start Time 0850    PT Stop Time 0930    PT Time Calculation (min) 40 min    Activity Tolerance Patient tolerated treatment well    Behavior During Therapy [facility] for tasks assessed/performed             History reviewed. No pertinent past medical history. Past Surgical History:  Procedure Laterality Date   HARVEST BONE GRAFT  02/21/2022   Procedure: HARVEST ILIAC BONE GRAFT;  Surgeon: Barnett Abu, MD;  Location: [facility] OR;  Service: Neurosurgery;;   Patient Active Problem List   Diagnosis Date Noted   Lumbar burst fracture, closed, initial encounter (HCC) 02/19/2022    PCP: Patient, No Pcp Per   REFERRING PROVIDER: Barnett Abu, MD  REFERRING DIAG: S32.040A (ICD-10-CM) - Wedge compression fracture of fourth lumbar vertebra (HCC)  Rationale for Evaluation and Treatment: Rehabilitation  THERAPY DIAG:  Other low back pain  Orthopedic aftercare for healing pathologic vertebral fracture  Muscle weakness (generalized)  ONSET DATE: 02/21/22 DOS  SUBJECTIVE:                                                                                                                                                                                           SUBJECTIVE STATEMENT: Missed last visit as transportation did not pick him up.  Back pain resolving when transferring sit/stand.  Has been compliant with HEP  PERTINENT HISTORY:    PAIN:  Are you having pain? Yes: NPRS scale: 0-7/10 Pain location: low back Pain description: ache  Aggravating factors: position changes,  prolonged activities Relieving factors: position changes  PRECAUTIONS: Back  WEIGHT BEARING RESTRICTIONS: No  FALLS:  Has patient fallen in last 6 months? No   OCCUPATION: not working  PLOF: Independent  PATIENT GOALS: To manage and reduce my symptoms  NEXT MD VISIT: PRN  OBJECTIVE:   DIAGNOSTIC FINDINGS:  IMPRESSION: 1. Acute superior endplate fracture at L3 with loss of height of less than 10%. No retropulsed bone. 2. Acute burst compression fracture of the L4 vertebral body. Posterior bowing of the posterosuperior margin of the vertebral body by distance of 4 mm. This indents the  thecal sac slightly and causes mild narrowing of the lateral recesses but no visible neural compression. No traumatic disc herniation. 3. Nondisplaced fracture of the left transverse process of L4, better shown by CT. 4. Retroperitoneal edema in the region of the fractures. No large hematoma.     Electronically Signed   By: Paulina Fusi M.D.   On: 02/20/2022 17:51  PATIENT SURVEYS:  FOTO 49(60 predicted)  SCREENING FOR RED FLAGS: Bowel or bladder incontinence: No  MUSCLE LENGTH: Hamstrings: Right 90 deg; Left 90 deg Thomas test: negative B  POSTURE: decreased lumbar lordosis  PALPATION: Taught lumbar paraspinals   LUMBAR ROM:   AROM eval  Flexion 50%  Extension 25%  Right lateral flexion 50%  Left lateral flexion 50%  Right rotation   Left rotation    (Blank rows = not tested)  LOWER EXTREMITY ROM:   WNL throughout  Active  Right eval Left eval  Hip flexion    Hip extension    Hip abduction    Hip adduction    Hip internal rotation    Hip external rotation    Knee flexion    Knee extension    Ankle dorsiflexion    Ankle plantarflexion    Ankle inversion    Ankle eversion     (Blank rows = not tested)  LOWER EXTREMITY MMT:    MMT Right eval Left eval  Hip flexion 4 4  Hip extension 4 4  Hip abduction 4 4  Hip adduction    Hip internal rotation     Hip external rotation    Knee flexion 4 4  Knee extension 4 4  Ankle dorsiflexion    Ankle plantarflexion 4 4  Ankle inversion    Ankle eversion    Core/abdominal 3 3   (Blank rows = not tested)  LUMBAR SPECIAL TESTS:  Straight leg raise test: Negative, Slump test: Negative, and Thomas test: Negative  FUNCTIONAL TESTS:  5 times sit to stand: 16s arms crossed  GAIT: Distance walked: 81ftx2 Assistive device utilized: None Level of assistance: Complete Independence Comments: decreased trunk rotation  TODAY'S TREATMENT:     [facility] Adult PT Treatment:                                                DATE: 07/21/22 Therapeutic Exercise: Nustep L5 8 min Seated hamstring stretch 30s x2 Supine QL stretch 30s x2 Curl up L2 15x 5# ball  PPT with alt. March 10/10 3# Bridge 15x 5# ball  Hip flexor stretch 30s x2 B Plank on knees 30s, plank on toes 30s x2 Bird dog 10/10 Sit to stand transfers using stick to maintain spinal alignment          [facility] Adult PT Treatment:                                                DATE: 07/14/22 Therapeutic Exercise: Nustep L4 8 min Seated hamstring stretch 30s x2 Supine QL stretch 30s x2 Curl up L2 15x Ppt 3s 5x as w/u PPT with alt. March 10/10 Bridge 15x Hip flexor stretch 30s x2 B Plank on knees 30s x2 Bird dog 10/10    [facility] Adult PT Treatment:  DATE: 07/13/22 Therapeutic Exercise: Nustep level 5 x 5 mins while gathering subjective info Runners step up 8" x10 BIL Standing hip abduction/extension RTB at ankles 2x10 each BIL SLR with contralateral pilates ring push 2x10 LTR x10 BIL SKTC x30" BIL DKTC x30"  Prone on elbows x2' STS hands on knees x5, arms extended x3 Deadlifting progression no weight to 10# KB - cues for pacing and form                                                                                                                DATE: 07/07/22 Eval    PATIENT EDUCATION:   Education details: Discussed eval findings, rehab rationale and POC and patient is in agreement  Person educated: Patient Education method: Explanation Education comprehension: verbalized understanding and needs further education  HOME EXERCISE PROGRAM: Access Code: [email or web address] URL: [email or web address] Date: 07/07/2022 Prepared by: Gustavus Bryant  Exercises - Supine Quadratus Lumborum Stretch  - 2 x daily - 5 x weekly - 1 sets - 2 reps - 30s hold - Supine March with Posterior Pelvic Tilt  - 2 x daily - 5 x weekly - 2 sets - 10 reps - 3s hold - Hooklying Single Knee to Chest Stretch  - 2 x daily - 5 x weekly - 1 sets - 2 reps - 30s hold - Static Prone on Elbows  - 2 x daily - 5 x weekly - 1 sets - 1 reps - 2 min hold  ASSESSMENT:  CLINICAL IMPRESSION: Increased resistance and difficulty of tasks today to address core strength deficits.  Advanced to plank on toes with core weakness evident.  Cuing needed for proper form and body mechanics to ensure activation of appropriate muscle groups.    Patient presents to first follow up PT session reporting continued lower back pain especially with bending and prolonged sitting and walking. He reports HEP compliance. Session today focused on proximal hip and core strengthening. With STS he tends to compensate with hands on knees and little forward trunk lean. Encouraged "nose over toes" with patient able to perform with cueing. Patient continues to benefit from skilled PT services and should be progressed as able to improve functional independence.   OBJECTIVE IMPAIRMENTS: Abnormal gait, decreased activity tolerance, decreased endurance, decreased knowledge of condition, decreased mobility, decreased ROM, decreased strength, increased fascial restrictions, impaired flexibility, postural dysfunction, and pain.   ACTIVITY LIMITATIONS: carrying, lifting, bending, sitting, standing, and squatting  PERSONAL FACTORS: Age, Fitness, and Time  since onset of injury/illness/exacerbation are also affecting patient's functional outcome.   REHAB POTENTIAL: Good  CLINICAL DECISION MAKING: Stable/uncomplicated  EVALUATION COMPLEXITY: Low   GOALS: Goals reviewed with patient? No  SHORT TERM GOALS: Target date: 07/28/2022  Patient to demonstrate independence in HEP  Baseline: [email or web address] Goal status: Met  2.  Decrease 5x STS time to <15s arms crossed Baseline: 16s arms crossed Goal status: INITIAL   LONG TERM GOALS: Target date: 08/18/2022  Increase FOTO score to 60 Baseline: 49 Goal status: INITIAL  2.  Increase  core/abdominal strength to 4/5 Baseline: 3/5 Goal status: INITIAL  3.  Increase BLE strength to 4+/5 Baseline:  MMT Right eval Left eval  Hip flexion 4 4  Hip extension 4 4  Hip abduction 4 4  Hip adduction    Hip internal rotation    Hip external rotation    Knee flexion 4 4  Knee extension 4 4  Ankle dorsiflexion    Ankle plantarflexion 4 4   Goal status: INITIAL  4.  Decrease worst pain/discomfort to 4/10 Baseline: 7/10 Goal status: INITIAL  5.  Increase trunk ROM to 50% extension, 75% remaining motions Baseline:  AROM eval  Flexion 50%  Extension 25%  Right lateral flexion 50%  Left lateral flexion 50%   Goal status: INITIAL   PLAN:  PT FREQUENCY: 1-2x/week  PT DURATION: 6 weeks  PLANNED INTERVENTIONS: Therapeutic exercises, Therapeutic activity, Neuromuscular re-education, Balance training, Gait training, Patient/Family education, Self Care, Joint mobilization, Spinal mobilization, Cryotherapy, Moist heat, Manual therapy, and Re-evaluation.  PLAN FOR NEXT SESSION: HEP review and update, manual techniques as appropriate, aerobic tasks, ROM and flexibility activities, strengthening and PREs, TPDN, gait and balance training as needed   Check all possible CPT codes: 40981 - PT Re-evaluation, 97110- Therapeutic Exercise, [phone]- Neuro Re-education, [phone] - Gait Training, [phone] - Manual  Therapy, 97530 - Therapeutic Activities, and [phone] - Self Care    Check all conditions that are expected to impact treatment: {Conditions expected to impact treatment:Contractures, spasticity or fracture relevant to requested treatment   If treatment provided at initial evaluation, no treatment charged due to lack of authorization.       Hildred Laser, PT 07/21/2022, 9:40 AM

## 2022-07-21 ENCOUNTER — Ambulatory Visit: Payer: Medicaid Other

## 2022-07-21 DIAGNOSIS — M8448XD Pathological fracture, other site, subsequent encounter for fracture with routine healing: Secondary | ICD-10-CM

## 2022-07-21 DIAGNOSIS — M6281 Muscle weakness (generalized): Secondary | ICD-10-CM

## 2022-07-21 DIAGNOSIS — M5459 Other low back pain: Secondary | ICD-10-CM | POA: Diagnosis present

## 2022-07-23 NOTE — Therapy (Unsigned)
OUTPATIENT PHYSICAL THERAPY TREATMENT NOTE   Patient Name: Tyler Thomas MRN: [id number] DOB:1980-11-23, 42 y.o., male Today's Date: 07/24/2022  END OF SESSION:  PT End of Session - 07/24/22 1134     Visit Number 5    Number of Visits 13    Date for PT Re-Evaluation 09/01/22    Authorization Type [place] [facility]    Authorization Time Period 12 visits approved 07/10/22-09/08/22    Authorization - Number of Visits 12    PT Start Time 1130    PT Stop Time 1210    PT Time Calculation (min) 40 min    Activity Tolerance Patient tolerated treatment well    Behavior During Therapy [employer] for tasks assessed/performed             History reviewed. No pertinent past medical history. Past Surgical History:  Procedure Laterality Date   HARVEST BONE GRAFT  02/21/2022   Procedure: HARVEST ILIAC BONE GRAFT;  Surgeon: Barnett Abu, MD;  Location: [facility] OR;  Service: Neurosurgery;;   Patient Active Problem List   Diagnosis Date Noted   Lumbar burst fracture, closed, initial encounter (HCC) 02/19/2022    PCP: Patient, No Pcp Per   REFERRING PROVIDER: Barnett Abu, MD  REFERRING DIAG: S32.040A (ICD-10-CM) - Wedge compression fracture of fourth lumbar vertebra (HCC)  Rationale for Evaluation and Treatment: Rehabilitation  THERAPY DIAG:  Other low back pain  Orthopedic aftercare for healing pathologic vertebral fracture  Muscle weakness (generalized)  ONSET DATE: 02/21/22 DOS  SUBJECTIVE:                                                                                                                                                                                           SUBJECTIVE STATEMENT: No pain at start of session.  Has tried jumping but notes back discomfort when landing  PERTINENT HISTORY:    PAIN:  Are you having pain? Yes: NPRS scale: 0-7/10 Pain location: low back Pain description: ache  Aggravating factors: position changes, prolonged activities Relieving factors: position  changes  PRECAUTIONS: Back  WEIGHT BEARING RESTRICTIONS: No  FALLS:  Has patient fallen in last 6 months? No   OCCUPATION: not working  PLOF: Independent  PATIENT GOALS: To manage and reduce my symptoms  NEXT MD VISIT: PRN  OBJECTIVE:   DIAGNOSTIC FINDINGS:  IMPRESSION: 1. Acute superior endplate fracture at L3 with loss of height of less than 10%. No retropulsed bone. 2. Acute burst compression fracture of the L4 vertebral body. Posterior bowing of the posterosuperior margin of the vertebral body by distance of 4 mm. This indents the thecal sac slightly and causes mild narrowing  of the lateral recesses but no visible neural compression. No traumatic disc herniation. 3. Nondisplaced fracture of the left transverse process of L4, better shown by CT. 4. Retroperitoneal edema in the region of the fractures. No large hematoma.     Electronically Signed   By: Paulina Fusi M.D.   On: 02/20/2022 17:51  PATIENT SURVEYS:  FOTO 49(60 predicted)  SCREENING FOR RED FLAGS: Bowel or bladder incontinence: No  MUSCLE LENGTH: Hamstrings: Right 90 deg; Left 90 deg Thomas test: negative B  POSTURE: decreased lumbar lordosis  PALPATION: Taught lumbar paraspinals   LUMBAR ROM:   AROM eval  Flexion 50%  Extension 25%  Right lateral flexion 50%  Left lateral flexion 50%  Right rotation   Left rotation    (Blank rows = not tested)  LOWER EXTREMITY ROM:   WNL throughout  Active  Right eval Left eval  Hip flexion    Hip extension    Hip abduction    Hip adduction    Hip internal rotation    Hip external rotation    Knee flexion    Knee extension    Ankle dorsiflexion    Ankle plantarflexion    Ankle inversion    Ankle eversion     (Blank rows = not tested)  LOWER EXTREMITY MMT:    MMT Right eval Left eval  Hip flexion 4 4  Hip extension 4 4  Hip abduction 4 4  Hip adduction    Hip internal rotation    Hip external rotation    Knee flexion 4 4   Knee extension 4 4  Ankle dorsiflexion    Ankle plantarflexion 4 4  Ankle inversion    Ankle eversion    Core/abdominal 3 3   (Blank rows = not tested)  LUMBAR SPECIAL TESTS:  Straight leg raise test: Negative, Slump test: Negative, and Thomas test: Negative  FUNCTIONAL TESTS:  5 times sit to stand: 16s arms crossed  GAIT: Distance walked: 20ftx2 Assistive device utilized: None Level of assistance: Complete Independence Comments: decreased trunk rotation  TODAY'S TREATMENT:     [facility] Adult PT Treatment:                                                DATE: 07/24/22 Therapeutic Exercise: Nustep L6 8 min Seated hamstring stretch 30s x2 Supine QL stretch 30s x2 Curl up L2 15x 5# ball starting OH 5# ball pass b/t knees and hands 15x Bridge 15x 2000g ball  Hip flexor stretch 30s x2 B Sit to stand transfers using stick to maintain spinal alignment 10x from low seat Squat to 5# KB on 4 in block, with and w/o weight 10x  [facility] Adult PT Treatment:                                                DATE: 07/21/22 Therapeutic Exercise: Nustep L5 8 min Seated hamstring stretch 30s x2 Supine QL stretch 30s x2 Curl up L2 15x 5# ball  PPT with alt. March 10/10 3# Bridge 15x 5# ball  Hip flexor stretch 30s x2 B Plank on knees 30s, plank on toes 30s x2 Bird dog 10/10 Sit to stand transfers using stick to maintain spinal alignment  [facility] Adult PT Treatment:                                                DATE: 07/14/22 Therapeutic Exercise: Nustep L4 8 min Seated hamstring stretch 30s x2 Supine QL stretch 30s x2 Curl up L2 15x Ppt 3s 5x as w/u PPT with alt. March 10/10 Bridge 15x Hip flexor stretch 30s x2 B Plank on knees 30s x2 Bird dog 10/10    [facility] Adult PT Treatment:                                                DATE: 07/13/22 Therapeutic Exercise: Nustep level 5 x 5 mins while gathering subjective info Runners step up 8" x10 BIL Standing hip abduction/extension RTB at  ankles 2x10 each BIL SLR with contralateral pilates ring push 2x10 LTR x10 BIL SKTC x30" BIL DKTC x30"  Prone on elbows x2' STS hands on knees x5, arms extended x3 Deadlifting progression no weight to 10# KB - cues for pacing and form                                                                                                                DATE: 07/07/22 Eval    PATIENT EDUCATION:  Education details: Discussed eval findings, rehab rationale and POC and patient is in agreement  Person educated: Patient Education method: Explanation Education comprehension: verbalized understanding and needs further education  HOME EXERCISE PROGRAM: Access Code: [email or web address] URL: [email or web address] Date: 07/07/2022 Prepared by: Gustavus Bryant  Exercises - Supine Quadratus Lumborum Stretch  - 2 x daily - 5 x weekly - 1 sets - 2 reps - 30s hold - Supine March with Posterior Pelvic Tilt  - 2 x daily - 5 x weekly - 2 sets - 10 reps - 3s hold - Hooklying Single Knee to Chest Stretch  - 2 x daily - 5 x weekly - 1 sets - 2 reps - 30s hold - Static Prone on Elbows  - 2 x daily - 5 x weekly - 1 sets - 1 reps - 2 min hold  ASSESSMENT:  CLINICAL IMPRESSION: Reviewed jump mechanics showing minimal hip flexion.  Instructed in proper mechanics which reduced low back pain.  Increased resistance and difficulty as noted.  Focus on body mechanic and squatting technique and advancing to lifting mechanics as noted.  Able to demo good squat mechanics when cued.  Patient presents to first follow up PT session reporting continued lower back pain especially with bending and prolonged sitting and walking. He reports HEP compliance. Session today focused on proximal hip and core strengthening. With STS he tends to compensate with hands on knees and little forward trunk lean. Encouraged "nose over toes" with patient able to perform with  cueing. Patient continues to benefit from skilled PT services and should be  progressed as able to improve functional independence.   OBJECTIVE IMPAIRMENTS: Abnormal gait, decreased activity tolerance, decreased endurance, decreased knowledge of condition, decreased mobility, decreased ROM, decreased strength, increased fascial restrictions, impaired flexibility, postural dysfunction, and pain.   ACTIVITY LIMITATIONS: carrying, lifting, bending, sitting, standing, and squatting  PERSONAL FACTORS: Age, Fitness, and Time since onset of injury/illness/exacerbation are also affecting patient's functional outcome.   REHAB POTENTIAL: Good  CLINICAL DECISION MAKING: Stable/uncomplicated  EVALUATION COMPLEXITY: Low   GOALS: Goals reviewed with patient? No  SHORT TERM GOALS: Target date: 07/28/2022  Patient to demonstrate independence in HEP  Baseline: [email or web address] Goal status: Met  2.  Decrease 5x STS time to <15s arms crossed Baseline: 16s arms crossed Goal status: INITIAL   LONG TERM GOALS: Target date: 08/18/2022  Increase FOTO score to 60 Baseline: 49 Goal status: INITIAL  2.  Increase core/abdominal strength to 4/5 Baseline: 3/5 Goal status: INITIAL  3.  Increase BLE strength to 4+/5 Baseline:  MMT Right eval Left eval  Hip flexion 4 4  Hip extension 4 4  Hip abduction 4 4  Hip adduction    Hip internal rotation    Hip external rotation    Knee flexion 4 4  Knee extension 4 4  Ankle dorsiflexion    Ankle plantarflexion 4 4   Goal status: INITIAL  4.  Decrease worst pain/discomfort to 4/10 Baseline: 7/10 Goal status: INITIAL  5.  Increase trunk ROM to 50% extension, 75% remaining motions Baseline:  AROM eval  Flexion 50%  Extension 25%  Right lateral flexion 50%  Left lateral flexion 50%   Goal status: INITIAL   PLAN:  PT FREQUENCY: 1-2x/week  PT DURATION: 6 weeks  PLANNED INTERVENTIONS: Therapeutic exercises, Therapeutic activity, Neuromuscular re-education, Balance training, Gait training, Patient/Family education, Self  Care, Joint mobilization, Spinal mobilization, Cryotherapy, Moist heat, Manual therapy, and Re-evaluation.  PLAN FOR NEXT SESSION: HEP review and update, manual techniques as appropriate, aerobic tasks, ROM and flexibility activities, strengthening and PREs, TPDN, gait and balance training as needed   Check all possible CPT codes: 16109 - PT Re-evaluation, 97110- Therapeutic Exercise, [phone]- Neuro Re-education, [phone] - Gait Training, [phone] - Manual Therapy, 97530 - Therapeutic Activities, and [phone] - Self Care    Check all conditions that are expected to impact treatment: {Conditions expected to impact treatment:Contractures, spasticity or fracture relevant to requested treatment   If treatment provided at initial evaluation, no treatment charged due to lack of authorization.       Hildred Laser, PT 07/24/2022, 12:11 PM

## 2022-07-24 ENCOUNTER — Ambulatory Visit: Payer: Medicaid Other

## 2022-07-24 DIAGNOSIS — M6281 Muscle weakness (generalized): Secondary | ICD-10-CM

## 2022-07-24 DIAGNOSIS — M8448XD Pathological fracture, other site, subsequent encounter for fracture with routine healing: Secondary | ICD-10-CM

## 2022-07-24 DIAGNOSIS — M5459 Other low back pain: Secondary | ICD-10-CM | POA: Diagnosis not present

## 2022-07-25 NOTE — Therapy (Unsigned)
OUTPATIENT PHYSICAL THERAPY TREATMENT NOTE   Patient Name: Tyler Thomas MRN: [id number] DOB:07-03-1980, 42 y.o., male Today's Date: 07/25/2022  END OF SESSION:    No past medical history on file. Past Surgical History:  Procedure Laterality Date   HARVEST BONE GRAFT  02/21/2022   Procedure: HARVEST ILIAC BONE GRAFT;  Surgeon: Barnett Abu, MD;  Location: [facility] OR;  Service: Neurosurgery;;   Patient Active Problem List   Diagnosis Date Noted   Lumbar burst fracture, closed, initial encounter (HCC) 02/19/2022    PCP: Patient, No Pcp Per   REFERRING PROVIDER: Barnett Abu, MD  REFERRING DIAG: S32.040A (ICD-10-CM) - Wedge compression fracture of fourth lumbar vertebra (HCC)  Rationale for Evaluation and Treatment: Rehabilitation  THERAPY DIAG:  No diagnosis found.  ONSET DATE: 02/21/22 DOS  SUBJECTIVE:                                                                                                                                                                                           SUBJECTIVE STATEMENT: No pain at start of session.  Has tried jumping but notes back discomfort when landing  PERTINENT HISTORY:    PAIN:  Are you having pain? Yes: NPRS scale: 0-7/10 Pain location: low back Pain description: ache  Aggravating factors: position changes, prolonged activities Relieving factors: position changes  PRECAUTIONS: Back  WEIGHT BEARING RESTRICTIONS: No  FALLS:  Has patient fallen in last 6 months? No   OCCUPATION: not working  PLOF: Independent  PATIENT GOALS: To manage and reduce my symptoms  NEXT MD VISIT: PRN  OBJECTIVE:   DIAGNOSTIC FINDINGS:  IMPRESSION: 1. Acute superior endplate fracture at L3 with loss of height of less than 10%. No retropulsed bone. 2. Acute burst compression fracture of the L4 vertebral body. Posterior bowing of the posterosuperior margin of the vertebral body by distance of 4 mm. This indents the thecal sac slightly and  causes mild narrowing of the lateral recesses but no visible neural compression. No traumatic disc herniation. 3. Nondisplaced fracture of the left transverse process of L4, better shown by CT. 4. Retroperitoneal edema in the region of the fractures. No large hematoma.     Electronically Signed   By: Paulina Fusi M.D.   On: 02/20/2022 17:51  PATIENT SURVEYS:  FOTO 49(60 predicted)  SCREENING FOR RED FLAGS: Bowel or bladder incontinence: No  MUSCLE LENGTH: Hamstrings: Right 90 deg; Left 90 deg Thomas test: negative B  POSTURE: decreased lumbar lordosis  PALPATION: Taught lumbar paraspinals   LUMBAR ROM:   AROM eval  Flexion 50%  Extension 25%  Right lateral flexion 50%  Left lateral flexion  50%  Right rotation   Left rotation    (Blank rows = not tested)  LOWER EXTREMITY ROM:   WNL throughout  Active  Right eval Left eval  Hip flexion    Hip extension    Hip abduction    Hip adduction    Hip internal rotation    Hip external rotation    Knee flexion    Knee extension    Ankle dorsiflexion    Ankle plantarflexion    Ankle inversion    Ankle eversion     (Blank rows = not tested)  LOWER EXTREMITY MMT:    MMT Right eval Left eval  Hip flexion 4 4  Hip extension 4 4  Hip abduction 4 4  Hip adduction    Hip internal rotation    Hip external rotation    Knee flexion 4 4  Knee extension 4 4  Ankle dorsiflexion    Ankle plantarflexion 4 4  Ankle inversion    Ankle eversion    Core/abdominal 3 3   (Blank rows = not tested)  LUMBAR SPECIAL TESTS:  Straight leg raise test: Negative, Slump test: Negative, and Thomas test: Negative  FUNCTIONAL TESTS:  5 times sit to stand: 16s arms crossed  GAIT: Distance walked: 59ftx2 Assistive device utilized: None Level of assistance: Complete Independence Comments: decreased trunk rotation  TODAY'S TREATMENT:     [facility] Adult PT Treatment:                                                DATE:  07/24/22 Therapeutic Exercise: Nustep L6 8 min Seated hamstring stretch 30s x2 Supine QL stretch 30s x2 Curl up L2 15x 5# ball starting OH 5# ball pass b/t knees and hands 15x Bridge 15x 2000g ball  Hip flexor stretch 30s x2 B Sit to stand transfers using stick to maintain spinal alignment 10x from low seat Squat to 5# KB on 4 in block, with and w/o weight 10x  [facility] Adult PT Treatment:                                                DATE: 07/21/22 Therapeutic Exercise: Nustep L5 8 min Seated hamstring stretch 30s x2 Supine QL stretch 30s x2 Curl up L2 15x 5# ball  PPT with alt. March 10/10 3# Bridge 15x 5# ball  Hip flexor stretch 30s x2 B Plank on knees 30s, plank on toes 30s x2 Bird dog 10/10 Sit to stand transfers using stick to maintain spinal alignment          [facility] Adult PT Treatment:                                                DATE: 07/14/22 Therapeutic Exercise: Nustep L4 8 min Seated hamstring stretch 30s x2 Supine QL stretch 30s x2 Curl up L2 15x Ppt 3s 5x as w/u PPT with alt. March 10/10 Bridge 15x Hip flexor stretch 30s x2 B Plank on knees 30s x2 Bird dog 10/10    [facility] Adult PT Treatment:  DATE: 07/13/22 Therapeutic Exercise: Nustep level 5 x 5 mins while gathering subjective info Runners step up 8" x10 BIL Standing hip abduction/extension RTB at ankles 2x10 each BIL SLR with contralateral pilates ring push 2x10 LTR x10 BIL SKTC x30" BIL DKTC x30"  Prone on elbows x2' STS hands on knees x5, arms extended x3 Deadlifting progression no weight to 10# KB - cues for pacing and form                                                                                                                DATE: 07/07/22 Eval    PATIENT EDUCATION:  Education details: Discussed eval findings, rehab rationale and POC and patient is in agreement  Person educated: Patient Education method: Explanation Education comprehension:  verbalized understanding and needs further education  HOME EXERCISE PROGRAM: Access Code: [email or web address] URL: [email or web address] Date: 07/07/2022 Prepared by: Gustavus Bryant  Exercises - Supine Quadratus Lumborum Stretch  - 2 x daily - 5 x weekly - 1 sets - 2 reps - 30s hold - Supine March with Posterior Pelvic Tilt  - 2 x daily - 5 x weekly - 2 sets - 10 reps - 3s hold - Hooklying Single Knee to Chest Stretch  - 2 x daily - 5 x weekly - 1 sets - 2 reps - 30s hold - Static Prone on Elbows  - 2 x daily - 5 x weekly - 1 sets - 1 reps - 2 min hold  ASSESSMENT:  CLINICAL IMPRESSION: Reviewed jump mechanics showing minimal hip flexion.  Instructed in proper mechanics which reduced low back pain.  Increased resistance and difficulty as noted.  Focus on body mechanic and squatting technique and advancing to lifting mechanics as noted.  Able to demo good squat mechanics when cued.  Patient presents to first follow up PT session reporting continued lower back pain especially with bending and prolonged sitting and walking. He reports HEP compliance. Session today focused on proximal hip and core strengthening. With STS he tends to compensate with hands on knees and little forward trunk lean. Encouraged "nose over toes" with patient able to perform with cueing. Patient continues to benefit from skilled PT services and should be progressed as able to improve functional independence.   OBJECTIVE IMPAIRMENTS: Abnormal gait, decreased activity tolerance, decreased endurance, decreased knowledge of condition, decreased mobility, decreased ROM, decreased strength, increased fascial restrictions, impaired flexibility, postural dysfunction, and pain.   ACTIVITY LIMITATIONS: carrying, lifting, bending, sitting, standing, and squatting  PERSONAL FACTORS: Age, Fitness, and Time since onset of injury/illness/exacerbation are also affecting patient's functional outcome.   REHAB POTENTIAL:  Good  CLINICAL DECISION MAKING: Stable/uncomplicated  EVALUATION COMPLEXITY: Low   GOALS: Goals reviewed with patient? No  SHORT TERM GOALS: Target date: 07/28/2022  Patient to demonstrate independence in HEP  Baseline: [email or web address] Goal status: Met  2.  Decrease 5x STS time to <15s arms crossed Baseline: 16s arms crossed Goal status: INITIAL   LONG TERM GOALS: Target date: 08/18/2022  Increase FOTO score to 60 Baseline:  49 Goal status: INITIAL  2.  Increase core/abdominal strength to 4/5 Baseline: 3/5 Goal status: INITIAL  3.  Increase BLE strength to 4+/5 Baseline:  MMT Right eval Left eval  Hip flexion 4 4  Hip extension 4 4  Hip abduction 4 4  Hip adduction    Hip internal rotation    Hip external rotation    Knee flexion 4 4  Knee extension 4 4  Ankle dorsiflexion    Ankle plantarflexion 4 4   Goal status: INITIAL  4.  Decrease worst pain/discomfort to 4/10 Baseline: 7/10 Goal status: INITIAL  5.  Increase trunk ROM to 50% extension, 75% remaining motions Baseline:  AROM eval  Flexion 50%  Extension 25%  Right lateral flexion 50%  Left lateral flexion 50%   Goal status: INITIAL   PLAN:  PT FREQUENCY: 1-2x/week  PT DURATION: 6 weeks  PLANNED INTERVENTIONS: Therapeutic exercises, Therapeutic activity, Neuromuscular re-education, Balance training, Gait training, Patient/Family education, Self Care, Joint mobilization, Spinal mobilization, Cryotherapy, Moist heat, Manual therapy, and Re-evaluation.  PLAN FOR NEXT SESSION: HEP review and update, manual techniques as appropriate, aerobic tasks, ROM and flexibility activities, strengthening and PREs, TPDN, gait and balance training as needed   Check all possible CPT codes: 16109 - PT Re-evaluation, 97110- Therapeutic Exercise, [phone]- Neuro Re-education, [phone] - Gait Training, [phone] - Manual Therapy, 97530 - Therapeutic Activities, and [phone] - Self Care    Check all conditions that are expected to  impact treatment: {Conditions expected to impact treatment:Contractures, spasticity or fracture relevant to requested treatment   If treatment provided at initial evaluation, no treatment charged due to lack of authorization.       Hildred Laser, PT 07/25/2022, 1:37 PM

## 2022-07-26 ENCOUNTER — Ambulatory Visit: Payer: Medicaid Other

## 2022-07-26 DIAGNOSIS — M5459 Other low back pain: Secondary | ICD-10-CM | POA: Diagnosis not present

## 2022-07-26 DIAGNOSIS — M6281 Muscle weakness (generalized): Secondary | ICD-10-CM

## 2022-07-26 DIAGNOSIS — M8448XD Pathological fracture, other site, subsequent encounter for fracture with routine healing: Secondary | ICD-10-CM

## 2022-07-28 NOTE — Therapy (Signed)
OUTPATIENT PHYSICAL THERAPY TREATMENT NOTE   Patient Name: Tyler Thomas MRN: [id number] DOB:[birth year], 42 y.o., male Today's Date: 07/31/2022  END OF SESSION:  PT End of Session - 07/31/22 1053     Visit Number 7    Number of Visits 13    Date for PT Re-Evaluation 09/01/22    Authorization Type [place] [employer]    Authorization Time Period 12 visits approved 07/10/22-09/08/22    Authorization - Number of Visits 12    PT Start Time 1045    PT Stop Time 1125    PT Time Calculation (min) 40 min    Activity Tolerance Patient tolerated treatment well    Behavior During Therapy [facility] for tasks assessed/performed               History reviewed. No pertinent past medical history. Past Surgical History:  Procedure Laterality Date   HARVEST BONE GRAFT  02/21/2022   Procedure: HARVEST ILIAC BONE GRAFT;  Surgeon: Barnett Abu, MD;  Location: [facility] OR;  Service: Neurosurgery;;   Patient Active Problem List   Diagnosis Date Noted   Lumbar burst fracture, closed, initial encounter (HCC) 02/19/2022    PCP: Patient, No Pcp Per   REFERRING PROVIDER: Barnett Abu, MD  REFERRING DIAG: S32.040A (ICD-10-CM) - Wedge compression fracture of fourth lumbar vertebra (HCC)  Rationale for Evaluation and Treatment: Rehabilitation  THERAPY DIAG:  Other low back pain  Orthopedic aftercare for healing pathologic vertebral fracture  Muscle weakness (generalized)  ONSET DATE: 02/21/22 DOS  SUBJECTIVE:                                                                                                                                                                                           SUBJECTIVE STATEMENT: No back symptoms over the weekend but symptoms flared up when carrying a pump weighing 40+ pounds  PERTINENT HISTORY:    PAIN:  Are you having pain? Yes: NPRS scale: 0-7/10 Pain location: low back Pain description: ache  Aggravating factors: position changes, prolonged activities Relieving  factors: position changes  PRECAUTIONS: Back  WEIGHT BEARING RESTRICTIONS: No  FALLS:  Has patient fallen in last 6 months? No   OCCUPATION: not working  PLOF: Independent  PATIENT GOALS: To manage and reduce my symptoms  NEXT MD VISIT: PRN  OBJECTIVE:   DIAGNOSTIC FINDINGS:  IMPRESSION: 1. Acute superior endplate fracture at L3 with loss of height of less than 10%. No retropulsed bone. 2. Acute burst compression fracture of the L4 vertebral body. Posterior bowing of the posterosuperior margin of the vertebral body by distance of 4 mm. This indents the thecal sac slightly and  causes mild narrowing of the lateral recesses but no visible neural compression. No traumatic disc herniation. 3. Nondisplaced fracture of the left transverse process of L4, better shown by CT. 4. Retroperitoneal edema in the region of the fractures. No large hematoma.     Electronically Signed   By: Paulina Fusi M.D.   On: 02/20/2022 17:51  PATIENT SURVEYS:  FOTO 49(60 predicted)  SCREENING FOR RED FLAGS: Bowel or bladder incontinence: No  MUSCLE LENGTH: Hamstrings: Right 90 deg; Left 90 deg Thomas test: negative B  POSTURE: decreased lumbar lordosis  PALPATION: Taught lumbar paraspinals   LUMBAR ROM:   AROM eval  Flexion 50%  Extension 25%  Right lateral flexion 50%  Left lateral flexion 50%  Right rotation   Left rotation    (Blank rows = not tested)  LOWER EXTREMITY ROM:   WNL throughout  Active  Right eval Left eval  Hip flexion    Hip extension    Hip abduction    Hip adduction    Hip internal rotation    Hip external rotation    Knee flexion    Knee extension    Ankle dorsiflexion    Ankle plantarflexion    Ankle inversion    Ankle eversion     (Blank rows = not tested)  LOWER EXTREMITY MMT:    MMT Right eval Left eval  Hip flexion 4 4  Hip extension 4 4  Hip abduction 4 4  Hip adduction    Hip internal rotation    Hip external rotation    Knee  flexion 4 4  Knee extension 4 4  Ankle dorsiflexion    Ankle plantarflexion 4 4  Ankle inversion    Ankle eversion    Core/abdominal 3 3   (Blank rows = not tested)  LUMBAR SPECIAL TESTS:  Straight leg raise test: Negative, Slump test: Negative, and Thomas test: Negative  FUNCTIONAL TESTS:  5 times sit to stand: 16s arms crossed  GAIT: Distance walked: 16ftx2 Assistive device utilized: None Level of assistance: Complete Independence Comments: decreased trunk rotation  TODAY'S TREATMENT:     [facility] Adult PT Treatment:                                                DATE: 07/31/22 Therapeutic Exercise: Nustep L6 8 min Goblet squats 15# DB 15x Squats holding physioball OH to promote spinal extension 15x Supine QL stretch 30s x2 Prone press with OP 10x2 Curl up L2 15x 3000g ball starting OH 3000g ball pass b/t knees and hands 15x Bridge 15x 3000g ball  Prone prop 2 min Hip flexor stretch 30s x2 B Bird dog 10/10 Standing trunk extension at counter 10x  [facility] Adult PT Treatment:                                                DATE: 07/26/22 Therapeutic Exercise: Nustep L6 8 min Seated hamstring stretch 30s x2 Supine QL stretch 30s x2 Curl up L2 15x 3000g ball starting OH 3000g ball pass b/t knees and hands 15x Bridge 15x 2000g ball  Prone prop 2 min Hip flexor stretch 30s x2 B Sit to stand transfers using stick to maintain spinal alignment 10x from  low seat Squat to 5# KB on 4 in block, with and w/o weight 10x  [facility] Adult PT Treatment:                                                DATE: 07/24/22 Therapeutic Exercise: Nustep L6 8 min Seated hamstring stretch 30s x2 Supine QL stretch 30s x2 Curl up L2 15x 5# ball starting OH 5# ball pass b/t knees and hands 15x Bridge 15x 2000g ball  Hip flexor stretch 30s x2 B Sit to stand transfers using stick to maintain spinal alignment 10x from low seat Squat to 5# KB on 4 in block, with and w/o weight 10x  [facility] Adult PT Treatment:                                                 DATE: 07/21/22 Therapeutic Exercise: Nustep L5 8 min Seated hamstring stretch 30s x2 Supine QL stretch 30s x2 Curl up L2 15x 5# ball  PPT with alt. March 10/10 3# Bridge 15x 5# ball  Hip flexor stretch 30s x2 B Plank on knees 30s, plank on toes 30s x2 Bird dog 10/10 Sit to stand transfers using stick to maintain spinal alignment          [facility] Adult PT Treatment:                                                DATE: 07/14/22 Therapeutic Exercise: Nustep L4 8 min Seated hamstring stretch 30s x2 Supine QL stretch 30s x2 Curl up L2 15x Ppt 3s 5x as w/u PPT with alt. March 10/10 Bridge 15x Hip flexor stretch 30s x2 B Plank on knees 30s x2 Bird dog 10/10    [facility] Adult PT Treatment:                                                DATE: 07/13/22 Therapeutic Exercise: Nustep level 5 x 5 mins while gathering subjective info Runners step up 8" x10 BIL Standing hip abduction/extension RTB at ankles 2x10 each BIL SLR with contralateral pilates ring push 2x10 LTR x10 BIL SKTC x30" BIL DKTC x30"  Prone on elbows x2' STS hands on knees x5, arms extended x3 Deadlifting progression no weight to 10# KB - cues for pacing and form                                                                                                                DATE:  07/07/22 Eval    PATIENT EDUCATION:  Education details: Discussed eval findings, rehab rationale and POC and patient is in agreement  Person educated: Patient Education method: Explanation Education comprehension: verbalized understanding and needs further education  HOME EXERCISE PROGRAM: Access Code: [email or web address] URL: [email or web address] Date: 07/07/2022 Prepared by: Gustavus Bryant  Exercises - Supine Quadratus Lumborum Stretch  - 2 x daily - 5 x weekly - 1 sets - 2 reps - 30s hold - Supine March with Posterior Pelvic Tilt  - 2 x daily - 5 x weekly - 2 sets - 10 reps - 3s hold -  Hooklying Single Knee to Chest Stretch  - 2 x daily - 5 x weekly - 1 sets - 2 reps - 30s hold - Static Prone on Elbows  - 2 x daily - 5 x weekly - 1 sets - 1 reps - 2 min hold  ASSESSMENT:  CLINICAL IMPRESSION: Improving spinal extension but needs to be more aware of posture and spinal position.  Instructed in standing lumbar extension at counter to increase intersegmental mobility and continued prone press ups with PT OP.  Incorporated more functional squatting tasks to improve body mechnics.  Patient presents to first follow up PT session reporting continued lower back pain especially with bending and prolonged sitting and walking. He reports HEP compliance. Session today focused on proximal hip and core strengthening. With STS he tends to compensate with hands on knees and little forward trunk lean. Encouraged "nose over toes" with patient able to perform with cueing. Patient continues to benefit from skilled PT services and should be progressed as able to improve functional independence.   OBJECTIVE IMPAIRMENTS: Abnormal gait, decreased activity tolerance, decreased endurance, decreased knowledge of condition, decreased mobility, decreased ROM, decreased strength, increased fascial restrictions, impaired flexibility, postural dysfunction, and pain.   ACTIVITY LIMITATIONS: carrying, lifting, bending, sitting, standing, and squatting  PERSONAL FACTORS: Age, Fitness, and Time since onset of injury/illness/exacerbation are also affecting patient's functional outcome.   REHAB POTENTIAL: Good  CLINICAL DECISION MAKING: Stable/uncomplicated  EVALUATION COMPLEXITY: Low   GOALS: Goals reviewed with patient? No  SHORT TERM GOALS: Target date: 07/28/2022  Patient to demonstrate independence in HEP  Baseline: [email or web address] Goal status: Met  2.  Decrease 5x STS time to <15s arms crossed Baseline: 16s arms crossed Goal status: INITIAL   LONG TERM GOALS: Target date: 08/18/2022  Increase FOTO  score to 60 Baseline: 49 Goal status: INITIAL  2.  Increase core/abdominal strength to 4/5 Baseline: 3/5 Goal status: INITIAL  3.  Increase BLE strength to 4+/5 Baseline:  MMT Right eval Left eval  Hip flexion 4 4  Hip extension 4 4  Hip abduction 4 4  Hip adduction    Hip internal rotation    Hip external rotation    Knee flexion 4 4  Knee extension 4 4  Ankle dorsiflexion    Ankle plantarflexion 4 4   Goal status: INITIAL  4.  Decrease worst pain/discomfort to 4/10 Baseline: 7/10 Goal status: INITIAL  5.  Increase trunk ROM to 50% extension, 75% remaining motions Baseline:  AROM eval  Flexion 50%  Extension 25%  Right lateral flexion 50%  Left lateral flexion 50%   Goal status: INITIAL   PLAN:  PT FREQUENCY: 1-2x/week  PT DURATION: 6 weeks  PLANNED INTERVENTIONS: Therapeutic exercises, Therapeutic activity, Neuromuscular re-education, Balance training, Gait training, Patient/Family education, Self Care, Joint mobilization, Spinal mobilization, Cryotherapy, Moist heat, Manual therapy, and Re-evaluation.  PLAN FOR NEXT SESSION: HEP review  and update, manual techniques as appropriate, aerobic tasks, ROM and flexibility activities, strengthening and PREs, TPDN, gait and balance training as needed   Check all possible CPT codes: 40981 - PT Re-evaluation, 97110- Therapeutic Exercise, [phone]- Neuro Re-education, [phone] - Gait Training, [phone] - Manual Therapy, 97530 - Therapeutic Activities, and [phone] - Self Care    Check all conditions that are expected to impact treatment: {Conditions expected to impact treatment:Contractures, spasticity or fracture relevant to requested treatment   If treatment provided at initial evaluation, no treatment charged due to lack of authorization.       Hildred Laser, PT 07/31/2022, 11:37 AM

## 2022-07-31 ENCOUNTER — Ambulatory Visit: Payer: Medicaid Other

## 2022-07-31 DIAGNOSIS — M5459 Other low back pain: Secondary | ICD-10-CM | POA: Diagnosis not present

## 2022-07-31 DIAGNOSIS — M8448XD Pathological fracture, other site, subsequent encounter for fracture with routine healing: Secondary | ICD-10-CM

## 2022-07-31 DIAGNOSIS — M6281 Muscle weakness (generalized): Secondary | ICD-10-CM

## 2022-08-02 ENCOUNTER — Ambulatory Visit: Payer: Medicaid Other

## 2022-08-02 DIAGNOSIS — M6281 Muscle weakness (generalized): Secondary | ICD-10-CM

## 2022-08-02 DIAGNOSIS — M8448XD Pathological fracture, other site, subsequent encounter for fracture with routine healing: Secondary | ICD-10-CM

## 2022-08-02 DIAGNOSIS — M5459 Other low back pain: Secondary | ICD-10-CM

## 2022-08-02 NOTE — Therapy (Signed)
OUTPATIENT PHYSICAL THERAPY TREATMENT NOTE   Patient Name: Tyler Thomas MRN: [id number] DOB:18-Apr-1980, 42 y.o., male Today's Date: 08/02/2022  END OF SESSION:  PT End of Session - 08/02/22 1002     Visit Number 8    Number of Visits 13    Date for PT Re-Evaluation 09/01/22    Authorization Type [place] MCD    Authorization Time Period 12 visits approved 07/10/22-09/08/22    Authorization - Visit Number 2    Authorization - Number of Visits 12    PT Start Time 1000    PT Stop Time 1038    PT Time Calculation (min) 38 min    Activity Tolerance Patient tolerated treatment well    Behavior During Therapy [facility] for tasks assessed/performed             History reviewed. No pertinent past medical history. Past Surgical History:  Procedure Laterality Date   HARVEST BONE GRAFT  02/21/2022   Procedure: HARVEST ILIAC BONE GRAFT;  Surgeon: Barnett Abu, MD;  Location: [facility] OR;  Service: Neurosurgery;;   Patient Active Problem List   Diagnosis Date Noted   Lumbar burst fracture, closed, initial encounter (HCC) 02/19/2022    PCP: Patient, No Pcp Per   REFERRING PROVIDER: Barnett Abu, MD  REFERRING DIAG: S32.040A (ICD-10-CM) - Wedge compression fracture of fourth lumbar vertebra (HCC)  Rationale for Evaluation and Treatment: Rehabilitation  THERAPY DIAG:  Other low back pain  Orthopedic aftercare for healing pathologic vertebral fracture  Muscle weakness (generalized)  ONSET DATE: 02/21/22 DOS  SUBJECTIVE:                                                                                                                                                                                           SUBJECTIVE STATEMENT: Patient reports that he isn't really having much pain in his back today, but does have muscle soreness from the last session.   PERTINENT HISTORY:    PAIN:  Are you having pain? Yes: NPRS scale: 0-7/10 Pain location: low back Pain description: ache  Aggravating  factors: position changes, prolonged activities Relieving factors: position changes  PRECAUTIONS: Back  WEIGHT BEARING RESTRICTIONS: No  FALLS:  Has patient fallen in last 6 months? No   OCCUPATION: not working  PLOF: Independent  PATIENT GOALS: To manage and reduce my symptoms  NEXT MD VISIT: PRN  OBJECTIVE:   DIAGNOSTIC FINDINGS:  IMPRESSION: 1. Acute superior endplate fracture at L3 with loss of height of less than 10%. No retropulsed bone. 2. Acute burst compression fracture of the L4 vertebral body. Posterior bowing of the posterosuperior margin of the vertebral body  by distance of 4 mm. This indents the thecal sac slightly and causes mild narrowing of the lateral recesses but no visible neural compression. No traumatic disc herniation. 3. Nondisplaced fracture of the left transverse process of L4, better shown by CT. 4. Retroperitoneal edema in the region of the fractures. No large hematoma.     Electronically Signed   By: Paulina Fusi M.D.   On: 02/20/2022 17:51  PATIENT SURVEYS:  FOTO 49(60 predicted)  SCREENING FOR RED FLAGS: Bowel or bladder incontinence: No  MUSCLE LENGTH: Hamstrings: Right 90 deg; Left 90 deg Thomas test: negative B  POSTURE: decreased lumbar lordosis  PALPATION: Taught lumbar paraspinals   LUMBAR ROM:   AROM eval  Flexion 50%  Extension 25%  Right lateral flexion 50%  Left lateral flexion 50%  Right rotation   Left rotation    (Blank rows = not tested)  LOWER EXTREMITY ROM:   WNL throughout  Active  Right eval Left eval  Hip flexion    Hip extension    Hip abduction    Hip adduction    Hip internal rotation    Hip external rotation    Knee flexion    Knee extension    Ankle dorsiflexion    Ankle plantarflexion    Ankle inversion    Ankle eversion     (Blank rows = not tested)  LOWER EXTREMITY MMT:    MMT Right eval Left eval  Hip flexion 4 4  Hip extension 4 4  Hip abduction 4 4  Hip adduction     Hip internal rotation    Hip external rotation    Knee flexion 4 4  Knee extension 4 4  Ankle dorsiflexion    Ankle plantarflexion 4 4  Ankle inversion    Ankle eversion    Core/abdominal 3 3   (Blank rows = not tested)  LUMBAR SPECIAL TESTS:  Straight leg raise test: Negative, Slump test: Negative, and Thomas test: Negative  FUNCTIONAL TESTS:  5 times sit to stand: 16s arms crossed  GAIT: Distance walked: 69ftx2 Assistive device utilized: None Level of assistance: Complete Independence Comments: decreased trunk rotation  TODAY'S TREATMENT:     [facility] Adult PT Treatment:                                                DATE: 08/02/22 Therapeutic Exercise: Nustep L6 8 min Goblet squats 15# DB 15x Supine figure 4 stretch push/pull x30" each BIL Supine QL stretch 30s x2 3000g ball pass b/t knees and hands x10 Prone prop 2 min Hip flexor stretch x1' BIL Standing trunk extension at counter 10x Qped thread the needle thoracic rotation x10 BIL Seated pball roll outs fwd x10    [facility] Adult PT Treatment:                                                DATE: 07/31/22 Therapeutic Exercise: Nustep L6 8 min Goblet squats 15# DB 15x Squats holding physioball OH to promote spinal extension 15x Supine QL stretch 30s x2 Prone press with OP 10x2 Curl up L2 15x 3000g ball starting OH 3000g ball pass b/t knees and hands 15x Bridge 15x 3000g ball  Prone prop 2  min Hip flexor stretch 30s x2 B Bird dog 10/10 Standing trunk extension at counter 10x  [facility] Adult PT Treatment:                                                DATE: 07/26/22 Therapeutic Exercise: Nustep L6 8 min Seated hamstring stretch 30s x2 Supine QL stretch 30s x2 Curl up L2 15x 3000g ball starting OH 3000g ball pass b/t knees and hands 15x Bridge 15x 2000g ball  Prone prop 2 min Hip flexor stretch 30s x2 B Sit to stand transfers using stick to maintain spinal alignment 10x from low seat Squat to 5# KB on 4 in block,  with and w/o weight 10x    PATIENT EDUCATION:  Education details: Discussed eval findings, rehab rationale and POC and patient is in agreement  Person educated: Patient Education method: Explanation Education comprehension: verbalized understanding and needs further education  HOME EXERCISE PROGRAM: Access Code: [email or web address] URL: [email or web address] Date: 07/07/2022 Prepared by: Gustavus Bryant  Exercises - Supine Quadratus Lumborum Stretch  - 2 x daily - 5 x weekly - 1 sets - 2 reps - 30s hold - Supine March with Posterior Pelvic Tilt  - 2 x daily - 5 x weekly - 2 sets - 10 reps - 3s hold - Hooklying Single Knee to Chest Stretch  - 2 x daily - 5 x weekly - 1 sets - 2 reps - 30s hold - Static Prone on Elbows  - 2 x daily - 5 x weekly - 1 sets - 1 reps - 2 min hold  ASSESSMENT:  CLINICAL IMPRESSION:  Patient presents to PT reporting no current pain in his back, but that he does have muscle soreness from the last session. Session today continued to focus on lumbar ROM, proximal hip and core strengthening, and stretching. He had an increase in lower back pain with supine ball pass, regressed rest of session to accommodate increased pain and muscle fatigue. Patient continues to benefit from skilled PT services and should be progressed as able to improve functional independence.    OBJECTIVE IMPAIRMENTS: Abnormal gait, decreased activity tolerance, decreased endurance, decreased knowledge of condition, decreased mobility, decreased ROM, decreased strength, increased fascial restrictions, impaired flexibility, postural dysfunction, and pain.   ACTIVITY LIMITATIONS: carrying, lifting, bending, sitting, standing, and squatting  PERSONAL FACTORS: Age, Fitness, and Time since onset of injury/illness/exacerbation are also affecting patient's functional outcome.   REHAB POTENTIAL: Good  CLINICAL DECISION MAKING: Stable/uncomplicated  EVALUATION COMPLEXITY: Low   GOALS: Goals  reviewed with patient? No  SHORT TERM GOALS: Target date: 07/28/2022  Patient to demonstrate independence in HEP  Baseline: [email or web address] Goal status: Met  2.  Decrease 5x STS time to <15s arms crossed Baseline: 16s arms crossed Goal status: INITIAL   LONG TERM GOALS: Target date: 08/18/2022  Increase FOTO score to 60 Baseline: 49 Goal status: INITIAL  2.  Increase core/abdominal strength to 4/5 Baseline: 3/5 Goal status: INITIAL  3.  Increase BLE strength to 4+/5 Baseline:  MMT Right eval Left eval  Hip flexion 4 4  Hip extension 4 4  Hip abduction 4 4  Hip adduction    Hip internal rotation    Hip external rotation    Knee flexion 4 4  Knee extension 4 4  Ankle dorsiflexion    Ankle plantarflexion 4 4   Goal  status: INITIAL  4.  Decrease worst pain/discomfort to 4/10 Baseline: 7/10 Goal status: INITIAL  5.  Increase trunk ROM to 50% extension, 75% remaining motions Baseline:  AROM eval  Flexion 50%  Extension 25%  Right lateral flexion 50%  Left lateral flexion 50%   Goal status: INITIAL   PLAN:  PT FREQUENCY: 1-2x/week  PT DURATION: 6 weeks  PLANNED INTERVENTIONS: Therapeutic exercises, Therapeutic activity, Neuromuscular re-education, Balance training, Gait training, Patient/Family education, Self Care, Joint mobilization, Spinal mobilization, Cryotherapy, Moist heat, Manual therapy, and Re-evaluation.  PLAN FOR NEXT SESSION: HEP review and update, manual techniques as appropriate, aerobic tasks, ROM and flexibility activities, strengthening and PREs, TPDN, gait and balance training as needed   Check all possible CPT codes: 16109 - PT Re-evaluation, 97110- Therapeutic Exercise, [phone]- Neuro Re-education, [phone] - Gait Training, [phone] - Manual Therapy, 97530 - Therapeutic Activities, and [phone] - Self Care    Check all conditions that are expected to impact treatment: {Conditions expected to impact treatment:Contractures, spasticity or fracture relevant to  requested treatment   If treatment provided at initial evaluation, no treatment charged due to lack of authorization.       Berta Minor, PTA 08/02/2022, 10:40 AM

## 2022-08-03 NOTE — Therapy (Unsigned)
OUTPATIENT PHYSICAL THERAPY TREATMENT NOTE   Patient Name: Tyler Thomas MRN: [id number] DOB:1980/06/11, 42 y.o., male Today's Date: 08/07/2022  END OF SESSION:  PT End of Session - 08/07/22 1009     Visit Number 9    Number of Visits 13    Date for PT Re-Evaluation 09/01/22    Authorization Type [place] [employer]    Authorization Time Period 12 visits approved 07/10/22-09/08/22    Authorization - Number of Visits 12    PT Start Time 1008   patient arrived late   PT Stop Time 1040    PT Time Calculation (min) 32 min    Activity Tolerance Patient tolerated treatment well    Behavior During Therapy [facility] for tasks assessed/performed              History reviewed. No pertinent past medical history. Past Surgical History:  Procedure Laterality Date   HARVEST BONE GRAFT  02/21/2022   Procedure: HARVEST ILIAC BONE GRAFT;  Surgeon: Barnett Abu, MD;  Location: [facility] OR;  Service: Neurosurgery;;   Patient Active Problem List   Diagnosis Date Noted   Lumbar burst fracture, closed, initial encounter (HCC) 02/19/2022    PCP: Patient, No Pcp Per   REFERRING PROVIDER: Barnett Abu, MD  REFERRING DIAG: S32.040A (ICD-10-CM) - Wedge compression fracture of fourth lumbar vertebra (HCC)  Rationale for Evaluation and Treatment: Rehabilitation  THERAPY DIAG:  Other low back pain  Orthopedic aftercare for healing pathologic vertebral fracture  Muscle weakness (generalized)  ONSET DATE: 02/21/22 DOS  SUBJECTIVE:                                                                                                                                                                                           SUBJECTIVE STATEMENT: Patient arrives to PT w/o back pain.  Reports he is now able to arise from sitting w/o any difficulty  PERTINENT HISTORY:    PAIN:  Are you having pain? Yes: NPRS scale: 0-7/10 Pain location: low back Pain description: ache  Aggravating factors: position changes, prolonged  activities Relieving factors: position changes  PRECAUTIONS: Back  WEIGHT BEARING RESTRICTIONS: No  FALLS:  Has patient fallen in last 6 months? No   OCCUPATION: not working  PLOF: Independent  PATIENT GOALS: To manage and reduce my symptoms  NEXT MD VISIT: PRN  OBJECTIVE:   DIAGNOSTIC FINDINGS:  IMPRESSION: 1. Acute superior endplate fracture at L3 with loss of height of less than 10%. No retropulsed bone. 2. Acute burst compression fracture of the L4 vertebral body. Posterior bowing of the posterosuperior margin of the vertebral body by distance of 4 mm. This  indents the thecal sac slightly and causes mild narrowing of the lateral recesses but no visible neural compression. No traumatic disc herniation. 3. Nondisplaced fracture of the left transverse process of L4, better shown by CT. 4. Retroperitoneal edema in the region of the fractures. No large hematoma.     Electronically Signed   By: Paulina Fusi M.D.   On: 02/20/2022 17:51  PATIENT SURVEYS:  FOTO 49(60 predicted)  SCREENING FOR RED FLAGS: Bowel or bladder incontinence: No  MUSCLE LENGTH: Hamstrings: Right 90 deg; Left 90 deg Thomas test: negative B  POSTURE: decreased lumbar lordosis  PALPATION: Taught lumbar paraspinals   LUMBAR ROM:   AROM eval  Flexion 50%  Extension 25%  Right lateral flexion 50%  Left lateral flexion 50%  Right rotation   Left rotation    (Blank rows = not tested)  LOWER EXTREMITY ROM:   WNL throughout  Active  Right eval Left eval  Hip flexion    Hip extension    Hip abduction    Hip adduction    Hip internal rotation    Hip external rotation    Knee flexion    Knee extension    Ankle dorsiflexion    Ankle plantarflexion    Ankle inversion    Ankle eversion     (Blank rows = not tested)  LOWER EXTREMITY MMT:    MMT Right eval Left eval  Hip flexion 4 4  Hip extension 4 4  Hip abduction 4 4  Hip adduction    Hip internal rotation    Hip  external rotation    Knee flexion 4 4  Knee extension 4 4  Ankle dorsiflexion    Ankle plantarflexion 4 4  Ankle inversion    Ankle eversion    Core/abdominal 3 3   (Blank rows = not tested)  LUMBAR SPECIAL TESTS:  Straight leg raise test: Negative, Slump test: Negative, and Thomas test: Negative  FUNCTIONAL TESTS:  5 times sit to stand: 16s arms crossed  GAIT: Distance walked: 24ftx2 Assistive device utilized: None Level of assistance: Complete Independence Comments: decreased trunk rotation  TODAY'S TREATMENT:     [facility] Adult PT Treatment:                                                DATE: 08/07/22 Therapeutic Exercise: Nustep L7 8 min Lunge to high side of bolster 15/15 Goblet squats 20# DB 20x Squats holding physioball OH to promote spinal extension 15x Supine QL stretch 30s x2 Prone press 10x f/b 10x with elbow locked dropping hips f/b 10x with PT OP Plank on knees 30s x2 Plank on knees with hip extension 30s ea.    [facility] Adult PT Treatment:                                                DATE: 08/02/22 Therapeutic Exercise: Nustep L6 8 min Goblet squats 15# DB 15x Supine figure 4 stretch push/pull x30" each BIL Supine QL stretch 30s x2 3000g ball pass b/t knees and hands x10 Prone prop 2 min Hip flexor stretch x1' BIL Standing trunk extension at counter 10x Qped thread the needle thoracic rotation x10 BIL Seated pball roll outs fwd x10  [facility] Adult PT Treatment:                                                DATE: 07/31/22 Therapeutic Exercise: Nustep L6 8 min Goblet squats 15# DB 15x Squats holding physioball OH to promote spinal extension 15x Supine QL stretch 30s x2 Prone press with OP 10x2 Curl up L2 15x 3000g ball starting OH 3000g ball pass b/t knees and hands 15x Bridge 15x 3000g ball  Prone prop 2 min Hip flexor stretch 30s x2 B Bird dog 10/10 Standing trunk extension at counter 10x  [facility] Adult PT Treatment:                                                 DATE: 07/26/22 Therapeutic Exercise: Nustep L6 8 min Seated hamstring stretch 30s x2 Supine QL stretch 30s x2 Curl up L2 15x 3000g ball starting OH 3000g ball pass b/t knees and hands 15x Bridge 15x 2000g ball  Prone prop 2 min Hip flexor stretch 30s x2 B Sit to stand transfers using stick to maintain spinal alignment 10x from low seat Squat to 5# KB on 4 in block, with and w/o weight 10x    PATIENT EDUCATION:  Education details: Discussed eval findings, rehab rationale and POC and patient is in agreement  Person educated: Patient Education method: Explanation Education comprehension: verbalized understanding and needs further education  HOME EXERCISE PROGRAM: Access Code: [email or web address] URL: [email or web address] Date: 07/07/2022 Prepared by: Gustavus Bryant  Exercises - Supine Quadratus Lumborum Stretch  - 2 x daily - 5 x weekly - 1 sets - 2 reps - 30s hold - Supine March with Posterior Pelvic Tilt  - 2 x daily - 5 x weekly - 2 sets - 10 reps - 3s hold - Hooklying Single Knee to Chest Stretch  - 2 x daily - 5 x weekly - 1 sets - 2 reps - 30s hold - Static Prone on Elbows  - 2 x daily - 5 x weekly - 1 sets - 1 reps - 2 min hold  ASSESSMENT:  CLINICAL IMPRESSION: Increased resistance on aerobic work and with squatting tasks.  Now able to maintain lumbar lordosis when arising from sitting.  Continued need of cuing with QL stretching for proper form.   Advanced abdominal strengthening by incorporating plank tasks.  Noted weakness and fatigue when more challenging plank position introduced.   OBJECTIVE IMPAIRMENTS: Abnormal gait, decreased activity tolerance, decreased endurance, decreased knowledge of condition, decreased mobility, decreased ROM, decreased strength, increased fascial restrictions, impaired flexibility, postural dysfunction, and pain.   ACTIVITY LIMITATIONS: carrying, lifting, bending, sitting, standing, and squatting  PERSONAL FACTORS: Age,  Fitness, and Time since onset of injury/illness/exacerbation are also affecting patient's functional outcome.   REHAB POTENTIAL: Good  CLINICAL DECISION MAKING: Stable/uncomplicated  EVALUATION COMPLEXITY: Low   GOALS: Goals reviewed with patient? No  SHORT TERM GOALS: Target date: 07/28/2022  Patient to demonstrate independence in HEP  Baseline: [email or web address] Goal status: Met  2.  Decrease 5x STS time to <15s arms crossed Baseline: 16s arms crossed Goal status: INITIAL   LONG TERM GOALS: Target date: 08/18/2022  Increase FOTO score to 60 Baseline: 49 Goal status: INITIAL  2.  Increase core/abdominal  strength to 4/5 Baseline: 3/5 Goal status: INITIAL  3.  Increase BLE strength to 4+/5 Baseline:  MMT Right eval Left eval  Hip flexion 4 4  Hip extension 4 4  Hip abduction 4 4  Hip adduction    Hip internal rotation    Hip external rotation    Knee flexion 4 4  Knee extension 4 4  Ankle dorsiflexion    Ankle plantarflexion 4 4   Goal status: INITIAL  4.  Decrease worst pain/discomfort to 4/10 Baseline: 7/10 Goal status: INITIAL  5.  Increase trunk ROM to 50% extension, 75% remaining motions Baseline:  AROM eval  Flexion 50%  Extension 25%  Right lateral flexion 50%  Left lateral flexion 50%   Goal status: INITIAL   PLAN:  PT FREQUENCY: 1-2x/week  PT DURATION: 6 weeks  PLANNED INTERVENTIONS: Therapeutic exercises, Therapeutic activity, Neuromuscular re-education, Balance training, Gait training, Patient/Family education, Self Care, Joint mobilization, Spinal mobilization, Cryotherapy, Moist heat, Manual therapy, and Re-evaluation.  PLAN FOR NEXT SESSION: HEP review and update, manual techniques as appropriate, aerobic tasks, ROM and flexibility activities, strengthening and PREs, TPDN, gait and balance training as needed   Check all possible CPT codes: 56213 - PT Re-evaluation, 97110- Therapeutic Exercise, [phone]- Neuro Re-education, [phone] - Gait  Training, [phone] - Manual Therapy, 97530 - Therapeutic Activities, and [phone] - Self Care    Check all conditions that are expected to impact treatment: {Conditions expected to impact treatment:Contractures, spasticity or fracture relevant to requested treatment   If treatment provided at initial evaluation, no treatment charged due to lack of authorization.       Hildred Laser, PT 08/07/2022, 10:44 AM

## 2022-08-07 ENCOUNTER — Ambulatory Visit: Payer: Medicaid Other

## 2022-08-07 DIAGNOSIS — M8448XD Pathological fracture, other site, subsequent encounter for fracture with routine healing: Secondary | ICD-10-CM

## 2022-08-07 DIAGNOSIS — M6281 Muscle weakness (generalized): Secondary | ICD-10-CM

## 2022-08-07 DIAGNOSIS — M5459 Other low back pain: Secondary | ICD-10-CM | POA: Diagnosis not present

## 2022-08-07 NOTE — Therapy (Unsigned)
OUTPATIENT PHYSICAL THERAPY TREATMENT NOTE   Patient Name: Tyler Thomas MRN: [id number] DOB:June 19, 1980, 42 y.o., male Today's Date: 08/09/2022  END OF SESSION:  PT End of Session - 08/09/22 0957     Visit Number 10    Number of Visits 13    Date for PT Re-Evaluation 09/01/22    Authorization Type [place] [employer]    Authorization Time Period 12 visits approved 07/10/22-09/08/22    Authorization - Number of Visits 12    PT Start Time 1000    PT Stop Time 1040    PT Time Calculation (min) 40 min    Activity Tolerance Patient tolerated treatment well    Behavior During Therapy [facility] for tasks assessed/performed               History reviewed. No pertinent past medical history. Past Surgical History:  Procedure Laterality Date   HARVEST BONE GRAFT  02/21/2022   Procedure: HARVEST ILIAC BONE GRAFT;  Surgeon: Barnett Abu, MD;  [facility] OR;  Service: Neurosurgery;;   Patient Active Problem List   Diagnosis Date Noted   Lumbar burst fracture, closed, initial encounter (HCC) 02/19/2022    PCP: Patient, No Pcp Per   REFERRING PROVIDER: Barnett Abu, MD  REFERRING DIAG: S32.040A (ICD-10-CM) - Wedge compression fracture of fourth lumbar vertebra (HCC)  Rationale for Evaluation and Treatment: Rehabilitation  THERAPY DIAG:  Other low back pain  Orthopedic aftercare for healing pathologic vertebral fracture  Muscle weakness (generalized)  ONSET DATE: 02/21/22 DOS  SUBJECTIVE:                                                                                                                                                                                           SUBJECTIVE STATEMENT: Low back doing well.  Has noted upper back/thoracic discomfort after last session lasting 36 hours and since resolved.  PERTINENT HISTORY:    PAIN:  Are you having pain? Yes: NPRS scale: 0-7/10 Pain location: low back Pain description: ache  Aggravating factors: position changes, prolonged  activities Relieving factors: position changes  PRECAUTIONS: Back  WEIGHT BEARING RESTRICTIONS: No  FALLS:  Has patient fallen in last 6 months? No   OCCUPATION: not working  PLOF: Independent  PATIENT GOALS: To manage and reduce my symptoms  NEXT MD VISIT: PRN  OBJECTIVE:   DIAGNOSTIC FINDINGS:  IMPRESSION: 1. Acute superior endplate fracture at L3 with loss of height of less than 10%. No retropulsed bone. 2. Acute burst compression fracture of the L4 vertebral body. Posterior bowing of the posterosuperior margin of the vertebral body by distance of 4 mm. This indents the thecal sac  slightly and causes mild narrowing of the lateral recesses but no visible neural compression. No traumatic disc herniation. 3. Nondisplaced fracture of the left transverse process of L4, better shown by CT. 4. Retroperitoneal edema in the region of the fractures. No large hematoma.     Electronically Signed   By: Paulina Fusi M.D.   On: 02/20/2022 17:51  PATIENT SURVEYS:  FOTO 49(60 predicted) 08/09/22 53%  SCREENING FOR RED FLAGS: Bowel or bladder incontinence: No  MUSCLE LENGTH: Hamstrings: Right 90 deg; Left 90 deg Thomas test: negative B  POSTURE: decreased lumbar lordosis  PALPATION: Taught lumbar paraspinals   LUMBAR ROM:   AROM eval  Flexion 50%  Extension 25%  Right lateral flexion 50%  Left lateral flexion 50%  Right rotation   Left rotation    (Blank rows = not tested)  LOWER EXTREMITY ROM:   WNL throughout  Active  Right eval Left eval  Hip flexion    Hip extension    Hip abduction    Hip adduction    Hip internal rotation    Hip external rotation    Knee flexion    Knee extension    Ankle dorsiflexion    Ankle plantarflexion    Ankle inversion    Ankle eversion     (Blank rows = not tested)  LOWER EXTREMITY MMT:    MMT Right eval Left eval  Hip flexion 4 4  Hip extension 4 4  Hip abduction 4 4  Hip adduction    Hip internal rotation     Hip external rotation    Knee flexion 4 4  Knee extension 4 4  Ankle dorsiflexion    Ankle plantarflexion 4 4  Ankle inversion    Ankle eversion    Core/abdominal 3 3   (Blank rows = not tested)  LUMBAR SPECIAL TESTS:  Straight leg raise test: Negative, Slump test: Negative, and Thomas test: Negative  FUNCTIONAL TESTS:  5 times sit to stand: 16s arms crossed ;   GAIT: Distance walked: 21ftx2 Assistive device utilized: None Level of assistance: Complete Independence Comments: decreased trunk rotation  TODAY'S TREATMENT:    [facility] Adult PT Treatment:                                                DATE: 08/09/22 Therapeutic Exercise: Nustep L8 8 min 264ft ambulation with 2 10# KB low carry, 4101ft 10# KB high carry  Lunge to high side of bolster 15/15 Squats holding physioball OH to promote spinal extension 15x Supine QL stretch 30s x2 Prone prop 2 min Plank on knees 30s x1 Plank on knees with hip extension 30s x2 B  [facility] Adult PT Treatment:                                                DATE: 08/07/22 Therapeutic Exercise: Nustep L7 8 min Lunge to high side of bolster 15/15 Goblet squats 20# DB 20x Squats holding physioball OH to promote spinal extension 15x Supine QL stretch 30s x2 Prone press 10x f/b 10x with elbow locked dropping hips f/b 10x with PT OP Plank on knees 30s x2 Plank on knees with hip extension 30s ea.    [facility] Adult  PT Treatment:                                                DATE: 08/02/22 Therapeutic Exercise: Nustep L6 8 min Goblet squats 15# DB 15x Supine figure 4 stretch push/pull x30" each BIL Supine QL stretch 30s x2 3000g ball pass b/t knees and hands x10 Prone prop 2 min Hip flexor stretch x1' BIL Standing trunk extension at counter 10x Qped thread the needle thoracic rotation x10 BIL Seated pball roll outs fwd x10    [facility] Adult PT Treatment:                                                DATE: 07/31/22 Therapeutic Exercise: Nustep L6  8 min Goblet squats 15# DB 15x Squats holding physioball OH to promote spinal extension 15x Supine QL stretch 30s x2 Prone press with OP 10x2 Curl up L2 15x 3000g ball starting OH 3000g ball pass b/t knees and hands 15x Bridge 15x 3000g ball  Prone prop 2 min Hip flexor stretch 30s x2 B Bird dog 10/10 Standing trunk extension at counter 10x  [facility] Adult PT Treatment:                                                DATE: 07/26/22 Therapeutic Exercise: Nustep L6 8 min Seated hamstring stretch 30s x2 Supine QL stretch 30s x2 Curl up L2 15x 3000g ball starting OH 3000g ball pass b/t knees and hands 15x Bridge 15x 2000g ball  Prone prop 2 min Hip flexor stretch 30s x2 B Sit to stand transfers using stick to maintain spinal alignment 10x from low seat Squat to 5# KB on 4 in block, with and w/o weight 10x    PATIENT EDUCATION:  Education details: Discussed eval findings, rehab rationale and POC and patient is in agreement  Person educated: Patient Education method: Explanation Education comprehension: verbalized understanding and needs further education  HOME EXERCISE PROGRAM: Access Code: [email or web address] URL: [email or web address] Date: 07/07/2022 Prepared by: Gustavus Bryant  Exercises - Supine Quadratus Lumborum Stretch  - 2 x daily - 5 x weekly - 1 sets - 2 reps - 30s hold - Supine March with Posterior Pelvic Tilt  - 2 x daily - 5 x weekly - 2 sets - 10 reps - 3s hold - Hooklying Single Knee to Chest Stretch  - 2 x daily - 5 x weekly - 1 sets - 2 reps - 30s hold - Static Prone on Elbows  - 2 x daily - 5 x weekly - 1 sets - 1 reps - 2 min hold  ASSESSMENT:  CLINICAL IMPRESSION: Continues to remain restricted in extension ROM.  Palpation finds taught bands in lumbar multifidi.  Incorporated functional carry tasks and proper squat mechanics.  Discussed TPDN and will incorporate at next session   OBJECTIVE IMPAIRMENTS: Abnormal gait, decreased activity tolerance,  decreased endurance, decreased knowledge of condition, decreased mobility, decreased ROM, decreased strength, increased fascial restrictions, impaired flexibility, postural dysfunction, and pain.   ACTIVITY LIMITATIONS: carrying, lifting, bending, sitting, standing, and squatting  PERSONAL FACTORS: Age,  Fitness, and Time since onset of injury/illness/exacerbation are also affecting patient's functional outcome.   REHAB POTENTIAL: Good  CLINICAL DECISION MAKING: Stable/uncomplicated  EVALUATION COMPLEXITY: Low   GOALS: Goals reviewed with patient? No  SHORT TERM GOALS: Target date: 07/28/2022  Patient to demonstrate independence in HEP  Baseline: [email or web address] Goal status: Met  2.  Decrease 5x STS time to <15s arms crossed Baseline: 16s arms crossed; 08/09/22 8s arms crossed Goal status: Met   LONG TERM GOALS: Target date: 08/18/2022  Increase FOTO score to 60 Baseline: 49; 08/09/22 53% Goal status: ongoing  2.  Increase core/abdominal strength to 4/5 Baseline: 3/5 Goal status: INITIAL  3.  Increase BLE strength to 4+/5 Baseline:  MMT Right eval Left eval  Hip flexion 4 4  Hip extension 4 4  Hip abduction 4 4  Hip adduction    Hip internal rotation    Hip external rotation    Knee flexion 4 4  Knee extension 4 4  Ankle dorsiflexion    Ankle plantarflexion 4 4   Goal status: INITIAL  4.  Decrease worst pain/discomfort to 4/10 Baseline: 7/10; 08/09/22 2/10 Goal status: Met  5.  Increase trunk ROM to 50% extension, 75% remaining motions Baseline:  AROM eval  Flexion 50%  Extension 25%  Right lateral flexion 50%  Left lateral flexion 50%   Goal status: INITIAL   PLAN:  PT FREQUENCY: 1-2x/week  PT DURATION: 6 weeks  PLANNED INTERVENTIONS: Therapeutic exercises, Therapeutic activity, Neuromuscular re-education, Balance training, Gait training, Patient/Family education, Self Care, Joint mobilization, Spinal mobilization, Cryotherapy, Moist heat, Manual  therapy, and Re-evaluation.  PLAN FOR NEXT SESSION: HEP review and update, manual techniques as appropriate, aerobic tasks, ROM and flexibility activities, strengthening and PREs, TPDN, gait and balance training as needed   Check all possible CPT codes: 78295 - PT Re-evaluation, 97110- Therapeutic Exercise, [phone]- Neuro Re-education, [phone] - Gait Training, [phone] - Manual Therapy, 97530 - Therapeutic Activities, and [phone] - Self Care    Check all conditions that are expected to impact treatment: {Conditions expected to impact treatment:Contractures, spasticity or fracture relevant to requested treatment   If treatment provided at initial evaluation, no treatment charged due to lack of authorization.       Hildred Laser, PT 08/09/2022, 11:30 AM

## 2022-08-09 ENCOUNTER — Ambulatory Visit: Payer: Medicaid Other

## 2022-08-09 DIAGNOSIS — M6281 Muscle weakness (generalized): Secondary | ICD-10-CM

## 2022-08-09 DIAGNOSIS — M5459 Other low back pain: Secondary | ICD-10-CM | POA: Diagnosis not present

## 2022-08-09 DIAGNOSIS — M8448XD Pathological fracture, other site, subsequent encounter for fracture with routine healing: Secondary | ICD-10-CM

## 2022-08-14 ENCOUNTER — Ambulatory Visit: Payer: Medicaid Other

## 2022-08-19 NOTE — Therapy (Signed)
OUTPATIENT PHYSICAL THERAPY TREATMENT NOTE   Patient Name: Tyler Thomas MRN: [id number] DOB:1980/12/23, 42 y.o., male Today's Date: 08/22/2022  END OF SESSION:  PT End of Session - 08/22/22 0827     Visit Number 11    Number of Visits 13    Date for PT Re-Evaluation 09/01/22    Authorization Type [place] [facility]    Authorization Time Period 12 visits approved 07/10/22-09/08/22    Authorization - Visit Number 10    Authorization - Number of Visits 12    PT Start Time 0830    PT Stop Time 0908    PT Time Calculation (min) 38 min    Activity Tolerance Patient tolerated treatment well    Behavior During Therapy [facility] for tasks assessed/performed             History reviewed. No pertinent past medical history. Past Surgical History:  Procedure Laterality Date   HARVEST BONE GRAFT  02/21/2022   Procedure: HARVEST ILIAC BONE GRAFT;  Surgeon: Barnett Abu, MD;  Location: [facility] OR;  Service: Neurosurgery;;   Patient Active Problem List   Diagnosis Date Noted   Lumbar burst fracture, closed, initial encounter (HCC) 02/19/2022    PCP: Patient, No Pcp Per   REFERRING PROVIDER: Barnett Abu, MD  REFERRING DIAG: S32.040A (ICD-10-CM) - Wedge compression fracture of fourth lumbar vertebra (HCC)  Rationale for Evaluation and Treatment: Rehabilitation  THERAPY DIAG:  Other low back pain  Orthopedic aftercare for healing pathologic vertebral fracture  Muscle weakness (generalized)  ONSET DATE: 02/21/22 DOS  SUBJECTIVE:                                                                                                                                                                                           SUBJECTIVE STATEMENT: Patient reports continued improvements in his pain, notes increased pain when changing positions (supine to sit, sit to stand)  PERTINENT HISTORY:    PAIN:  Are you having pain? Yes: NPRS scale: 0-7/10 Pain location: low back Pain description: ache  Aggravating  factors: position changes, prolonged activities Relieving factors: position changes  PRECAUTIONS: Back  WEIGHT BEARING RESTRICTIONS: No  FALLS:  Has patient fallen in last 6 months? No   OCCUPATION: not working  PLOF: Independent  PATIENT GOALS: To manage and reduce my symptoms  NEXT MD VISIT: PRN  OBJECTIVE:   DIAGNOSTIC FINDINGS:  IMPRESSION: 1. Acute superior endplate fracture at L3 with loss of height of less than 10%. No retropulsed bone. 2. Acute burst compression fracture of the L4 vertebral body. Posterior bowing of the posterosuperior margin of the vertebral body by distance of 4  mm. This indents the thecal sac slightly and causes mild narrowing of the lateral recesses but no visible neural compression. No traumatic disc herniation. 3. Nondisplaced fracture of the left transverse process of L4, better shown by CT. 4. Retroperitoneal edema in the region of the fractures. No large hematoma.     Electronically Signed   By: Paulina Fusi M.D.   On: 02/20/2022 17:51  PATIENT SURVEYS:  FOTO 49(60 predicted) 08/09/22 53%  SCREENING FOR RED FLAGS: Bowel or bladder incontinence: No  MUSCLE LENGTH: Hamstrings: Right 90 deg; Left 90 deg Thomas test: negative B  POSTURE: decreased lumbar lordosis  PALPATION: Taught lumbar paraspinals   LUMBAR ROM:   AROM eval  Flexion 50%  Extension 25%  Right lateral flexion 50%  Left lateral flexion 50%  Right rotation   Left rotation    (Blank rows = not tested)  LOWER EXTREMITY ROM:   WNL throughout  Active  Right eval Left eval  Hip flexion    Hip extension    Hip abduction    Hip adduction    Hip internal rotation    Hip external rotation    Knee flexion    Knee extension    Ankle dorsiflexion    Ankle plantarflexion    Ankle inversion    Ankle eversion     (Blank rows = not tested)  LOWER EXTREMITY MMT:    MMT Right eval Left eval  Hip flexion 4 4  Hip extension 4 4  Hip abduction 4 4  Hip  adduction    Hip internal rotation    Hip external rotation    Knee flexion 4 4  Knee extension 4 4  Ankle dorsiflexion    Ankle plantarflexion 4 4  Ankle inversion    Ankle eversion    Core/abdominal 3 3   (Blank rows = not tested)  LUMBAR SPECIAL TESTS:  Straight leg raise test: Negative, Slump test: Negative, and Thomas test: Negative  FUNCTIONAL TESTS:  5 times sit to stand: 16s arms crossed ;   GAIT: Distance walked: 35ftx2 Assistive device utilized: None Level of assistance: Complete Independence Comments: decreased trunk rotation  TODAY'S TREATMENT:    [facility] Adult PT Treatment:                                                DATE: 08/19/22 Therapeutic Exercise: Nustep L5 5 min 228ft ambulation with 2 10# KB low carry, 480ft 10# KB high carry  Lunge to blue side bosu x15 BIL Squats holding physioball OH to promote spinal extension 15x Supine figure 4 piriformis stretch x30" BIL Supine QL stretch 30s x2 Prone prop 2 min Plank on knees 30s x1 Plank on feet 30s x2 SKTC x30" BIL DKTC x30" Seated figure 4 stretch x30"    [facility] Adult PT Treatment:                                                DATE: 08/09/22 Therapeutic Exercise: Nustep L8 8 min 226ft ambulation with 2 10# KB low carry, 440ft 10# KB high carry  Lunge to high side of bolster 15/15 Squats holding physioball OH to promote spinal extension 15x Supine QL stretch 30s x2 Prone prop 2  min Plank on knees 30s x1 Plank on knees with hip extension 30s x2 B  [facility] Adult PT Treatment:                                                DATE: 08/07/22 Therapeutic Exercise: Nustep L7 8 min Lunge to high side of bolster 15/15 Goblet squats 20# DB 20x Squats holding physioball OH to promote spinal extension 15x Supine QL stretch 30s x2 Prone press 10x f/b 10x with elbow locked dropping hips f/b 10x with PT OP Plank on knees 30s x2 Plank on knees with hip extension 30s ea.     PATIENT EDUCATION:  Education  details: Discussed eval findings, rehab rationale and POC and patient is in agreement  Person educated: Patient Education method: Explanation Education comprehension: verbalized understanding and needs further education  HOME EXERCISE PROGRAM: Access Code: [email or web address] URL: [email or web address] Date: 07/07/2022 Prepared by: Gustavus Bryant  Exercises - Supine Quadratus Lumborum Stretch  - 2 x daily - 5 x weekly - 1 sets - 2 reps - 30s hold - Supine March with Posterior Pelvic Tilt  - 2 x daily - 5 x weekly - 2 sets - 10 reps - 3s hold - Hooklying Single Knee to Chest Stretch  - 2 x daily - 5 x weekly - 1 sets - 2 reps - 30s hold - Static Prone on Elbows  - 2 x daily - 5 x weekly - 1 sets - 1 reps - 2 min hold  ASSESSMENT:  CLINICAL IMPRESSION:  Patient presents to PT reporting continued improvements in his back pain and states that it increased when he changes positions quickly. He is interested in [facility] next session. Session today continued to focus on strengthening and lumbar extension ROM. Patient was able to tolerate all prescribed exercises with no adverse effects. Patient continues to benefit from skilled PT services and should be progressed as able to improve functional independence.    OBJECTIVE IMPAIRMENTS: Abnormal gait, decreased activity tolerance, decreased endurance, decreased knowledge of condition, decreased mobility, decreased ROM, decreased strength, increased fascial restrictions, impaired flexibility, postural dysfunction, and pain.   ACTIVITY LIMITATIONS: carrying, lifting, bending, sitting, standing, and squatting  PERSONAL FACTORS: Age, Fitness, and Time since onset of injury/illness/exacerbation are also affecting patient's functional outcome.   REHAB POTENTIAL: Good  CLINICAL DECISION MAKING: Stable/uncomplicated  EVALUATION COMPLEXITY: Low   GOALS: Goals reviewed with patient? No  SHORT TERM GOALS: Target date: 07/28/2022  Patient to  demonstrate independence in HEP  Baseline: [email or web address] Goal status: Met  2.  Decrease 5x STS time to <15s arms crossed Baseline: 16s arms crossed; 08/09/22 8s arms crossed Goal status: Met   LONG TERM GOALS: Target date: 08/18/2022  Increase FOTO score to 60 Baseline: 49; 08/09/22 53% Goal status: ongoing  2.  Increase core/abdominal strength to 4/5 Baseline: 3/5 Goal status: INITIAL  3.  Increase BLE strength to 4+/5 Baseline:  MMT Right eval Left eval  Hip flexion 4 4  Hip extension 4 4  Hip abduction 4 4  Hip adduction    Hip internal rotation    Hip external rotation    Knee flexion 4 4  Knee extension 4 4  Ankle dorsiflexion    Ankle plantarflexion 4 4   Goal status: INITIAL  4.  Decrease worst pain/discomfort to 4/10 Baseline: 7/10; 08/09/22 2/10 Goal status: Met  5.  Increase trunk ROM to 50% extension, 75% remaining motions Baseline:  AROM eval  Flexion 50%  Extension 25%  Right lateral flexion 50%  Left lateral flexion 50%   Goal status: INITIAL   PLAN:  PT FREQUENCY: 1-2x/week  PT DURATION: 6 weeks  PLANNED INTERVENTIONS: Therapeutic exercises, Therapeutic activity, Neuromuscular re-education, Balance training, Gait training, Patient/Family education, Self Care, Joint mobilization, Spinal mobilization, Cryotherapy, Moist heat, Manual therapy, and Re-evaluation.  PLAN FOR NEXT SESSION: HEP review and update, manual techniques as appropriate, aerobic tasks, ROM and flexibility activities, strengthening and PREs, TPDN, gait and balance training as needed   Check all possible CPT codes: 16109 - PT Re-evaluation, 97110- Therapeutic Exercise, [phone]- Neuro Re-education, [phone] - Gait Training, [phone] - Manual Therapy, 97530 - Therapeutic Activities, and [phone] - Self Care    Check all conditions that are expected to impact treatment: {Conditions expected to impact treatment:Contractures, spasticity or fracture relevant to requested treatment   If treatment  provided at initial evaluation, no treatment charged due to lack of authorization.       Berta Minor, PTA 08/22/2022, 9:09 AM

## 2022-08-22 ENCOUNTER — Ambulatory Visit: Payer: Medicaid Other | Attending: Neurological Surgery

## 2022-08-22 DIAGNOSIS — M6281 Muscle weakness (generalized): Secondary | ICD-10-CM | POA: Diagnosis present

## 2022-08-22 DIAGNOSIS — M8448XD Pathological fracture, other site, subsequent encounter for fracture with routine healing: Secondary | ICD-10-CM | POA: Insufficient documentation

## 2022-08-22 DIAGNOSIS — M5459 Other low back pain: Secondary | ICD-10-CM | POA: Insufficient documentation

## 2022-08-25 NOTE — Therapy (Deleted)
OUTPATIENT PHYSICAL THERAPY TREATMENT NOTE   Patient Name: Tyler Thomas MRN: [id number] DOB:11-24-1980, 42 y.o., male Today's Date: 08/25/2022  END OF SESSION:    No past medical history on file. Past Surgical History:  Procedure Laterality Date   HARVEST BONE GRAFT  02/21/2022   Procedure: HARVEST ILIAC BONE GRAFT;  Surgeon: Barnett Abu, MD;  Location: [facility] OR;  Service: Neurosurgery;;   Patient Active Problem List   Diagnosis Date Noted   Lumbar burst fracture, closed, initial encounter (HCC) 02/19/2022    PCP: Patient, No Pcp Per   REFERRING PROVIDER: Barnett Abu, MD  REFERRING DIAG: S32.040A (ICD-10-CM) - Wedge compression fracture of fourth lumbar vertebra (HCC)  Rationale for Evaluation and Treatment: Rehabilitation  THERAPY DIAG:  No diagnosis found.  ONSET DATE: 02/21/22 DOS  SUBJECTIVE:                                                                                                                                                                                           SUBJECTIVE STATEMENT: Patient reports continued improvements in his pain, notes increased pain when changing positions (supine to sit, sit to stand)  PERTINENT HISTORY:    PAIN:  Are you having pain? Yes: NPRS scale: 0-7/10 Pain location: low back Pain description: ache  Aggravating factors: position changes, prolonged activities Relieving factors: position changes  PRECAUTIONS: Back  WEIGHT BEARING RESTRICTIONS: No  FALLS:  Has patient fallen in last 6 months? No   OCCUPATION: not working  PLOF: Independent  PATIENT GOALS: To manage and reduce my symptoms  NEXT MD VISIT: PRN  OBJECTIVE:   DIAGNOSTIC FINDINGS:  IMPRESSION: 1. Acute superior endplate fracture at L3 with loss of height of less than 10%. No retropulsed bone. 2. Acute burst compression fracture of the L4 vertebral body. Posterior bowing of the posterosuperior margin of the vertebral body by distance of 4 mm.  This indents the thecal sac slightly and causes mild narrowing of the lateral recesses but no visible neural compression. No traumatic disc herniation. 3. Nondisplaced fracture of the left transverse process of L4, better shown by CT. 4. Retroperitoneal edema in the region of the fractures. No large hematoma.     Electronically Signed   By: Paulina Fusi M.D.   On: 02/20/2022 17:51  PATIENT SURVEYS:  FOTO 49(60 predicted) 08/09/22 53%  SCREENING FOR RED FLAGS: Bowel or bladder incontinence: No  MUSCLE LENGTH: Hamstrings: Right 90 deg; Left 90 deg Thomas test: negative B  POSTURE: decreased lumbar lordosis  PALPATION: Taught lumbar paraspinals   LUMBAR ROM:   AROM eval  Flexion 50%  Extension 25%  Right lateral flexion  50%  Left lateral flexion 50%  Right rotation   Left rotation    (Blank rows = not tested)  LOWER EXTREMITY ROM:   WNL throughout  Active  Right eval Left eval  Hip flexion    Hip extension    Hip abduction    Hip adduction    Hip internal rotation    Hip external rotation    Knee flexion    Knee extension    Ankle dorsiflexion    Ankle plantarflexion    Ankle inversion    Ankle eversion     (Blank rows = not tested)  LOWER EXTREMITY MMT:    MMT Right eval Left eval  Hip flexion 4 4  Hip extension 4 4  Hip abduction 4 4  Hip adduction    Hip internal rotation    Hip external rotation    Knee flexion 4 4  Knee extension 4 4  Ankle dorsiflexion    Ankle plantarflexion 4 4  Ankle inversion    Ankle eversion    Core/abdominal 3 3   (Blank rows = not tested)  LUMBAR SPECIAL TESTS:  Straight leg raise test: Negative, Slump test: Negative, and Thomas test: Negative  FUNCTIONAL TESTS:  5 times sit to stand: 16s arms crossed ;   GAIT: Distance walked: 70ftx2 Assistive device utilized: None Level of assistance: Complete Independence Comments: decreased trunk rotation  TODAY'S TREATMENT:    [facility] Adult PT Treatment:                                                 DATE: 08/19/22 Therapeutic Exercise: Nustep L5 5 min 277ft ambulation with 2 10# KB low carry, 417ft 10# KB high carry  Lunge to blue side bosu x15 BIL Squats holding physioball OH to promote spinal extension 15x Supine figure 4 piriformis stretch x30" BIL Supine QL stretch 30s x2 Prone prop 2 min Plank on knees 30s x1 Plank on feet 30s x2 SKTC x30" BIL DKTC x30" Seated figure 4 stretch x30"    [facility] Adult PT Treatment:                                                DATE: 08/09/22 Therapeutic Exercise: Nustep L8 8 min 278ft ambulation with 2 10# KB low carry, 458ft 10# KB high carry  Lunge to high side of bolster 15/15 Squats holding physioball OH to promote spinal extension 15x Supine QL stretch 30s x2 Prone prop 2 min Plank on knees 30s x1 Plank on knees with hip extension 30s x2 B  [facility] Adult PT Treatment:                                                DATE: 08/07/22 Therapeutic Exercise: Nustep L7 8 min Lunge to high side of bolster 15/15 Goblet squats 20# DB 20x Squats holding physioball OH to promote spinal extension 15x Supine QL stretch 30s x2 Prone press 10x f/b 10x with elbow locked dropping hips f/b 10x with PT OP Plank on knees 30s x2 Plank on knees with hip extension 30s ea.  PATIENT EDUCATION:  Education details: Discussed eval findings, rehab rationale and POC and patient is in agreement  Person educated: Patient Education method: Explanation Education comprehension: verbalized understanding and needs further education  HOME EXERCISE PROGRAM: Access Code: [email or web address] URL: [email or web address] Date: 07/07/2022 Prepared by: Gustavus Bryant  Exercises - Supine Quadratus Lumborum Stretch  - 2 x daily - 5 x weekly - 1 sets - 2 reps - 30s hold - Supine March with Posterior Pelvic Tilt  - 2 x daily - 5 x weekly - 2 sets - 10 reps - 3s hold - Hooklying Single Knee to Chest Stretch  - 2 x daily - 5 x weekly -  1 sets - 2 reps - 30s hold - Static Prone on Elbows  - 2 x daily - 5 x weekly - 1 sets - 1 reps - 2 min hold  ASSESSMENT:  CLINICAL IMPRESSION:  Patient presents to PT reporting continued improvements in his back pain and states that it increased when he changes positions quickly. He is interested in [facility] session. Session today continued to focus on strengthening and lumbar extension ROM. Patient was able to tolerate all prescribed exercises with no adverse effects. Patient continues to benefit from skilled PT services and should be progressed as able to improve functional independence.    OBJECTIVE IMPAIRMENTS: Abnormal gait, decreased activity tolerance, decreased endurance, decreased knowledge of condition, decreased mobility, decreased ROM, decreased strength, increased fascial restrictions, impaired flexibility, postural dysfunction, and pain.   ACTIVITY LIMITATIONS: carrying, lifting, bending, sitting, standing, and squatting  PERSONAL FACTORS: Age, Fitness, and Time since onset of injury/illness/exacerbation are also affecting patient's functional outcome.   REHAB POTENTIAL: Good  CLINICAL DECISION MAKING: Stable/uncomplicated  EVALUATION COMPLEXITY: Low   GOALS: Goals reviewed with patient? No  SHORT TERM GOALS: Target date: 07/28/2022  Patient to demonstrate independence in HEP  Baseline: [email or web address] Goal status: Met  2.  Decrease 5x STS time to <15s arms crossed Baseline: 16s arms crossed; 08/09/22 8s arms crossed Goal status: Met   LONG TERM GOALS: Target date: 08/18/2022  Increase FOTO score to 60 Baseline: 49; 08/09/22 53% Goal status: ongoing  2.  Increase core/abdominal strength to 4/5 Baseline: 3/5 Goal status: INITIAL  3.  Increase BLE strength to 4+/5 Baseline:  MMT Right eval Left eval  Hip flexion 4 4  Hip extension 4 4  Hip abduction 4 4  Hip adduction    Hip internal rotation    Hip external rotation    Knee flexion 4 4  Knee extension 4 4   Ankle dorsiflexion    Ankle plantarflexion 4 4   Goal status: INITIAL  4.  Decrease worst pain/discomfort to 4/10 Baseline: 7/10; 08/09/22 2/10 Goal status: Met  5.  Increase trunk ROM to 50% extension, 75% remaining motions Baseline:  AROM eval  Flexion 50%  Extension 25%  Right lateral flexion 50%  Left lateral flexion 50%   Goal status: INITIAL   PLAN:  PT FREQUENCY: 1-2x/week  PT DURATION: 6 weeks  PLANNED INTERVENTIONS: Therapeutic exercises, Therapeutic activity, Neuromuscular re-education, Balance training, Gait training, Patient/Family education, Self Care, Joint mobilization, Spinal mobilization, Cryotherapy, Moist heat, Manual therapy, and Re-evaluation.  PLAN FOR NEXT SESSION: HEP review and update, manual techniques as appropriate, aerobic tasks, ROM and flexibility activities, strengthening and PREs, TPDN, gait and balance training as needed   Check all possible CPT codes: 64403 - PT Re-evaluation, 97110- Therapeutic Exercise, [phone]- Neuro Re-education, [phone] - Gait Training, [phone] - Manual Therapy, 97530 -  Therapeutic Activities, and 30865 - Self Care    Check all conditions that are expected to impact treatment: {Conditions expected to impact treatment:Contractures, spasticity or fracture relevant to requested treatment   If treatment provided at initial evaluation, no treatment charged due to lack of authorization.       Hildred Laser, PT 08/25/2022, 8:00 AM

## 2022-08-28 ENCOUNTER — Ambulatory Visit: Payer: Medicaid Other

## 2022-09-01 NOTE — Therapy (Unsigned)
OUTPATIENT PHYSICAL THERAPY TREATMENT NOTE   Patient Name: Tyler Thomas MRN: [id number] DOB:12-Sep-1980, 42 y.o., male Today's Date: 09/04/2022  END OF SESSION:  PT End of Session - 09/04/22 0748     Visit Number 12    Number of Visits 13    Date for PT Re-Evaluation 09/01/22    Authorization Type [place] [facility]    Authorization Time Period 12 visits approved 07/10/22-09/08/22    Authorization - Number of Visits 12    PT Start Time 0745    PT Stop Time 0825    PT Time Calculation (min) 40 min    Activity Tolerance Patient tolerated treatment well    Behavior During Therapy [facility] for tasks assessed/performed              History reviewed. No pertinent past medical history. Past Surgical History:  Procedure Laterality Date   HARVEST BONE GRAFT  02/21/2022   Procedure: HARVEST ILIAC BONE GRAFT;  Surgeon: Barnett Abu, MD;  Location: [facility] OR;  Service: Neurosurgery;;   Patient Active Problem List   Diagnosis Date Noted   Lumbar burst fracture, closed, initial encounter (HCC) 02/19/2022    PCP: Patient, No Pcp Per   REFERRING PROVIDER: Barnett Abu, MD  REFERRING DIAG: S32.040A (ICD-10-CM) - Wedge compression fracture of fourth lumbar vertebra (HCC)  Rationale for Evaluation and Treatment: Rehabilitation  THERAPY DIAG:  Other low back pain  Orthopedic aftercare for healing pathologic vertebral fracture  Muscle weakness (generalized)  ONSET DATE: 02/21/22 DOS  SUBJECTIVE:                                                                                                                                                                                           SUBJECTIVE STATEMENT: Patient reports continued improvements in his pain, notes increased pain when changing positions (supine to sit, sit to stand)  PERTINENT HISTORY:    PAIN:  Are you having pain? Yes: NPRS scale: 0-7/10 Pain location: low back Pain description: ache  Aggravating factors: position changes, prolonged  activities Relieving factors: position changes  PRECAUTIONS: Back  WEIGHT BEARING RESTRICTIONS: No  FALLS:  Has patient fallen in last 6 months? No   OCCUPATION: not working  PLOF: Independent  PATIENT GOALS: To manage and reduce my symptoms  NEXT MD VISIT: PRN  OBJECTIVE:   DIAGNOSTIC FINDINGS:  IMPRESSION: 1. Acute superior endplate fracture at L3 with loss of height of less than 10%. No retropulsed bone. 2. Acute burst compression fracture of the L4 vertebral body. Posterior bowing of the posterosuperior margin of the vertebral body by distance of 4 mm. This indents the thecal sac slightly  and causes mild narrowing of the lateral recesses but no visible neural compression. No traumatic disc herniation. 3. Nondisplaced fracture of the left transverse process of L4, better shown by CT. 4. Retroperitoneal edema in the region of the fractures. No large hematoma.     Electronically Signed   By: Paulina Fusi M.D.   On: 02/20/2022 17:51  PATIENT SURVEYS:  FOTO 49(60 predicted) 08/09/22 53%  SCREENING FOR RED FLAGS: Bowel or bladder incontinence: No  MUSCLE LENGTH: Hamstrings: Right 90 deg; Left 90 deg Thomas test: negative B  POSTURE: decreased lumbar lordosis  PALPATION: Taught lumbar paraspinals   LUMBAR ROM:   AROM eval  Flexion 50%  Extension 25%  Right lateral flexion 50%  Left lateral flexion 50%  Right rotation   Left rotation    (Blank rows = not tested)  LOWER EXTREMITY ROM:   WNL throughout  Active  Right eval Left eval  Hip flexion    Hip extension    Hip abduction    Hip adduction    Hip internal rotation    Hip external rotation    Knee flexion    Knee extension    Ankle dorsiflexion    Ankle plantarflexion    Ankle inversion    Ankle eversion     (Blank rows = not tested)  LOWER EXTREMITY MMT:    MMT Right eval Left eval  Hip flexion 4 4  Hip extension 4 4  Hip abduction 4 4  Hip adduction    Hip internal rotation     Hip external rotation    Knee flexion 4 4  Knee extension 4 4  Ankle dorsiflexion    Ankle plantarflexion 4 4  Ankle inversion    Ankle eversion    Core/abdominal 3 3   (Blank rows = not tested)  LUMBAR SPECIAL TESTS:  Straight leg raise test: Negative, Slump test: Negative, and Thomas test: Negative  FUNCTIONAL TESTS:  5 times sit to stand: 16s arms crossed ;   GAIT: Distance walked: 47ftx2 Assistive device utilized: None Level of assistance: Complete Independence Comments: decreased trunk rotation  TODAY'S TREATMENT:    [facility] Adult PT Treatment:                                                DATE: 09/04/22 Therapeutic Exercise: Nustep L4 8 min Supine figure 4 piriformis stretch x30" BIL Supine QL stretch 30s x2 Plank on feet 30s x2 Plank on knees with hip extension  Hip flexor stretch 30s x2 B Manual Therapy: Skilled palpation to identify taught bands in B multifidus  Trigger Point Dry Needling Treatment: Pre-treatment instruction: Patient instructed on dry needling rationale, procedures, and possible side effects including pain during treatment (achy,cramping feeling), bruising, drop of blood, lightheadedness, nausea, sweating. Patient Consent Given: Yes Education handout provided: No Muscles treated: B lumbar multifidi  Needle size and number: .30x80mm x 2 Electrical stimulation performed: No Parameters: N/A Treatment response/outcome: Twitch response elicited, Palpable decrease in muscle tension, and increased mobility Post-treatment instructions: Patient instructed to expect possible mild to moderate muscle soreness later today and/or tomorrow. Patient instructed in methods to reduce muscle soreness and to continue prescribed HEP. If patient was dry needled over the lung field, patient was instructed on signs and symptoms of pneumothorax and, however unlikely, to see immediate medical attention should they occur. Patient was  also educated on signs and symptoms of  infection and to seek medical attention should they occur. Patient verbalized understanding of these instructions and education.   [facility] Adult PT Treatment:                                                DATE: 08/19/22 Therapeutic Exercise: Nustep L5 5 min 241ft ambulation with 2 10# KB low carry, 430ft 10# KB high carry  Lunge to blue side bosu x15 BIL Squats holding physioball OH to promote spinal extension 15x Supine figure 4 piriformis stretch x30" BIL Supine QL stretch 30s x2 Prone prop 2 min Plank on knees 30s x1 Plank on feet 30s x2 SKTC x30" BIL DKTC x30" Seated figure 4 stretch x30"    [facility] Adult PT Treatment:                                                DATE: 08/09/22 Therapeutic Exercise: Nustep L8 8 min 232ft ambulation with 2 10# KB low carry, 441ft 10# KB high carry  Lunge to high side of bolster 15/15 Squats holding physioball OH to promote spinal extension 15x Supine QL stretch 30s x2 Prone prop 2 min Plank on knees 30s x1 Plank on knees with hip extension 30s x2 B  [facility] Adult PT Treatment:                                                DATE: 08/07/22 Therapeutic Exercise: Nustep L7 8 min Lunge to high side of bolster 15/15 Goblet squats 20# DB 20x Squats holding physioball OH to promote spinal extension 15x Supine QL stretch 30s x2 Prone press 10x f/b 10x with elbow locked dropping hips f/b 10x with PT OP Plank on knees 30s x2 Plank on knees with hip extension 30s ea.     PATIENT EDUCATION:  Education details: Discussed eval findings, rehab rationale and POC and patient is in agreement  Person educated: Patient Education method: Explanation Education comprehension: verbalized understanding and needs further education  HOME EXERCISE PROGRAM: Access Code: [email or web address] URL: [email or web address] Date: 07/07/2022 Prepared by: Gustavus Bryant  Exercises - Supine Quadratus Lumborum Stretch  - 2 x daily - 5 x weekly - 1 sets - 2 reps - 30s  hold - Supine March with Posterior Pelvic Tilt  - 2 x daily - 5 x weekly - 2 sets - 10 reps - 3s hold - Hooklying Single Knee to Chest Stretch  - 2 x daily - 5 x weekly - 1 sets - 2 reps - 30s hold - Static Prone on Elbows  - 2 x daily - 5 x weekly - 1 sets - 1 reps - 2 min hold  ASSESSMENT:  CLINICAL IMPRESSION: Began session with TPDN, marked twitch response elicited.  Followed up with aerobic tasks to increase blood flow, stretching tasks and advanced core strengthening.  Able to complete all tasks w/o symptom exacerbation.   OBJECTIVE IMPAIRMENTS: Abnormal gait, decreased activity tolerance, decreased endurance, decreased knowledge of condition, decreased mobility, decreased ROM, decreased strength, increased fascial restrictions, impaired flexibility, postural dysfunction, and pain.  ACTIVITY LIMITATIONS: carrying, lifting, bending, sitting, standing, and squatting  PERSONAL FACTORS: Age, Fitness, and Time since onset of injury/illness/exacerbation are also affecting patient's functional outcome.   REHAB POTENTIAL: Good  CLINICAL DECISION MAKING: Stable/uncomplicated  EVALUATION COMPLEXITY: Low   GOALS: Goals reviewed with patient? No  SHORT TERM GOALS: Target date: 07/28/2022  Patient to demonstrate independence in HEP  Baseline: [email or web address] Goal status: Met  2.  Decrease 5x STS time to <15s arms crossed Baseline: 16s arms crossed; 08/09/22 8s arms crossed Goal status: Met   LONG TERM GOALS: Target date: 08/18/2022  Increase FOTO score to 60 Baseline: 49; 08/09/22 53% Goal status: ongoing  2.  Increase core/abdominal strength to 4/5 Baseline: 3/5 Goal status: INITIAL  3.  Increase BLE strength to 4+/5 Baseline:  MMT Right eval Left eval  Hip flexion 4 4  Hip extension 4 4  Hip abduction 4 4  Hip adduction    Hip internal rotation    Hip external rotation    Knee flexion 4 4  Knee extension 4 4  Ankle dorsiflexion    Ankle plantarflexion 4 4   Goal status:  INITIAL  4.  Decrease worst pain/discomfort to 4/10 Baseline: 7/10; 08/09/22 2/10 Goal status: Met  5.  Increase trunk ROM to 50% extension, 75% remaining motions Baseline:  AROM eval  Flexion 50%  Extension 25%  Right lateral flexion 50%  Left lateral flexion 50%   Goal status: INITIAL   PLAN:  PT FREQUENCY: 1-2x/week  PT DURATION: 6 weeks  PLANNED INTERVENTIONS: Therapeutic exercises, Therapeutic activity, Neuromuscular re-education, Balance training, Gait training, Patient/Family education, Self Care, Joint mobilization, Spinal mobilization, Cryotherapy, Moist heat, Manual therapy, and Re-evaluation.  PLAN FOR NEXT SESSION: HEP review and update, manual techniques as appropriate, aerobic tasks, ROM and flexibility activities, strengthening and PREs, TPDN, gait and balance training as needed   Check all possible CPT codes: 95284 - PT Re-evaluation, 97110- Therapeutic Exercise, [phone]- Neuro Re-education, [phone] - Gait Training, [phone] - Manual Therapy, 97530 - Therapeutic Activities, and [phone] - Self Care    Check all conditions that are expected to impact treatment: {Conditions expected to impact treatment:Contractures, spasticity or fracture relevant to requested treatment   If treatment provided at initial evaluation, no treatment charged due to lack of authorization.       Hildred Laser, PT 09/04/2022, 8:30 AM

## 2022-09-04 ENCOUNTER — Ambulatory Visit: Payer: Medicaid Other

## 2022-09-04 DIAGNOSIS — M5459 Other low back pain: Secondary | ICD-10-CM | POA: Diagnosis not present

## 2022-09-04 DIAGNOSIS — M8448XD Pathological fracture, other site, subsequent encounter for fracture with routine healing: Secondary | ICD-10-CM

## 2022-09-04 DIAGNOSIS — M6281 Muscle weakness (generalized): Secondary | ICD-10-CM

## 2022-09-11 ENCOUNTER — Ambulatory Visit: Payer: Medicaid Other

## 2022-09-11 DIAGNOSIS — M5459 Other low back pain: Secondary | ICD-10-CM | POA: Diagnosis not present

## 2022-09-11 DIAGNOSIS — M8448XD Pathological fracture, other site, subsequent encounter for fracture with routine healing: Secondary | ICD-10-CM

## 2022-09-11 DIAGNOSIS — M6281 Muscle weakness (generalized): Secondary | ICD-10-CM

## 2022-09-11 NOTE — Therapy (Signed)
OUTPATIENT PHYSICAL THERAPY DISCHARGE NOTE   Patient Name: Tyler Thomas MRN: [id number] DOB:Dec 14, 1980, 42 y.o., male Today's Date: 09/11/2022 PHYSICAL THERAPY DISCHARGE SUMMARY  Visits from Start of Care: 13  Current functional level related to goals / functional outcomes: Goals met   Remaining deficits: stiffness   Education / Equipment: HEP   Patient agrees to discharge. Patient goals were met. Patient is being discharged due to meeting the stated rehab goals.  END OF SESSION:  PT End of Session - 09/11/22 1001     Visit Number 13    Number of Visits 13    Date for PT Re-Evaluation 09/01/22    Authorization Time Period 12 visits approved 07/10/22-09/08/22    Authorization - Number of Visits 12    PT Start Time 1000    PT Stop Time 1045    PT Time Calculation (min) 45 min    Activity Tolerance Patient tolerated treatment well    Behavior During Therapy [facility] for tasks assessed/performed               History reviewed. No pertinent past medical history. Past Surgical History:  Procedure Laterality Date   HARVEST BONE GRAFT  02/21/2022   Procedure: HARVEST ILIAC BONE GRAFT;  Surgeon: Barnett Abu, MD;  Location: [facility] OR;  Service: Neurosurgery;;   Patient Active Problem List   Diagnosis Date Noted   Lumbar burst fracture, closed, initial encounter (HCC) 02/19/2022    PCP: Patient, No Pcp Per   REFERRING PROVIDER: Barnett Abu, MD  REFERRING DIAG: S32.040A (ICD-10-CM) - Wedge compression fracture of fourth lumbar vertebra (HCC)  Rationale for Evaluation and Treatment: Rehabilitation  THERAPY DIAG:  Other low back pain  Orthopedic aftercare for healing pathologic vertebral fracture  Muscle weakness (generalized)  ONSET DATE: 02/21/22 DOS  SUBJECTIVE:                                                                                                                                                                                           SUBJECTIVE  STATEMENT: TPDN last session helpful.  Has been lifting and carrying heavy loads and work related tasks w/o setback.  Ready for self management.  PERTINENT HISTORY:    PAIN:  Are you having pain? Yes: NPRS scale: 0-7/10 Pain location: low back Pain description: ache  Aggravating factors: position changes, prolonged activities Relieving factors: position changes  PRECAUTIONS: Back  WEIGHT BEARING RESTRICTIONS: No  FALLS:  Has patient fallen in last 6 months? No   OCCUPATION: not working  PLOF: Independent  PATIENT GOALS: To manage and reduce my symptoms  NEXT MD VISIT: PRN  OBJECTIVE:  DIAGNOSTIC FINDINGS:  IMPRESSION: 1. Acute superior endplate fracture at L3 with loss of height of less than 10%. No retropulsed bone. 2. Acute burst compression fracture of the L4 vertebral body. Posterior bowing of the posterosuperior margin of the vertebral body by distance of 4 mm. This indents the thecal sac slightly and causes mild narrowing of the lateral recesses but no visible neural compression. No traumatic disc herniation. 3. Nondisplaced fracture of the left transverse process of L4, better shown by CT. 4. Retroperitoneal edema in the region of the fractures. No large hematoma.     Electronically Signed   By: Paulina Fusi M.D.   On: 02/20/2022 17:51  PATIENT SURVEYS:  FOTO 49(60 predicted) 08/09/22 53%; 09/11/22 65%  SCREENING FOR RED FLAGS: Bowel or bladder incontinence: No  MUSCLE LENGTH: Hamstrings: Right 90 deg; Left 90 deg Thomas test: negative B  POSTURE: decreased lumbar lordosis  PALPATION: Taught lumbar paraspinals   LUMBAR ROM:   AROM eval 09/11/22  Flexion 50% 75%  Extension 25% 75%  Right lateral flexion 50% 75%  Left lateral flexion 50% 75%  Right rotation    Left rotation     (Blank rows = not tested)  LOWER EXTREMITY ROM:   WNL throughout  Active  Right eval Left eval  Hip flexion    Hip extension    Hip abduction    Hip  adduction    Hip internal rotation    Hip external rotation    Knee flexion    Knee extension    Ankle dorsiflexion    Ankle plantarflexion    Ankle inversion    Ankle eversion     (Blank rows = not tested)  LOWER EXTREMITY MMT:    MMT Right eval Left eval B 09/11/22  Hip flexion 4 4 4+  Hip extension 4 4 4+  Hip abduction 4 4 4+  Hip adduction     Hip internal rotation     Hip external rotation     Knee flexion 4 4 4+  Knee extension 4 4 4+  Ankle dorsiflexion     Ankle plantarflexion 4 4 4+  Ankle inversion     Ankle eversion     Core/abdominal 3 3 4    (Blank rows = not tested)  LUMBAR SPECIAL TESTS:  Straight leg raise test: Negative, Slump test: Negative, and Thomas test: Negative  FUNCTIONAL TESTS:  5 times sit to stand: 16s arms crossed ;   GAIT: Distance walked: 109ftx2 Assistive device utilized: None Level of assistance: Complete Independence Comments: decreased trunk rotation  TODAY'S TREATMENT:    [facility] Adult PT Treatment:                                                DATE: 09/11/22 Therapeutic Exercise: Nustep L4 6 min Plank on toes 30s x2 P-ball curl up 15x B, 10/10 unilateral Manual Therapy: Skilled palpation to identify taught and irritable bands in B lumbar multifidi  Trigger Point Dry Needling Treatment: Pre-treatment instruction: Patient instructed on dry needling rationale, procedures, and possible side effects including pain during treatment (achy,cramping feeling), bruising, drop of blood, lightheadedness, nausea, sweating. Patient Consent Given: Yes Education handout provided: No Muscles treated: B lumbar multifidi  Needle size and number: .30x66mm x 2 Electrical stimulation performed: No Parameters: N/A Treatment response/outcome: Twitch response elicited, Palpable decrease in muscle tension, and  increased mobility Post-treatment instructions: Patient instructed to expect possible mild to moderate muscle soreness later today and/or  tomorrow. Patient instructed in methods to reduce muscle soreness and to continue prescribed HEP. If patient was dry needled over the lung field, patient was instructed on signs and symptoms of pneumothorax and, however unlikely, to see immediate medical attention should they occur. Patient was also educated on signs and symptoms of infection and to seek medical attention should they occur. Patient verbalized understanding of these instructions and education.   [facility] Adult PT Treatment:                                                DATE: 09/04/22 Therapeutic Exercise: Nustep L4 8 min Supine figure 4 piriformis stretch x30" BIL Supine QL stretch 30s x2 Plank on feet 30s x2 Plank on knees with hip extension  Hip flexor stretch 30s x2 B Manual Therapy: Skilled palpation to identify taught bands in B multifidus  Trigger Point Dry Needling Treatment: Pre-treatment instruction: Patient instructed on dry needling rationale, procedures, and possible side effects including pain during treatment (achy,cramping feeling), bruising, drop of blood, lightheadedness, nausea, sweating. Patient Consent Given: Yes Education handout provided: No Muscles treated: B lumbar multifidi  Needle size and number: .30x77mm x 2 Electrical stimulation performed: No Parameters: N/A Treatment response/outcome: Twitch response elicited, Palpable decrease in muscle tension, and increased mobility Post-treatment instructions: Patient instructed to expect possible mild to moderate muscle soreness later today and/or tomorrow. Patient instructed in methods to reduce muscle soreness and to continue prescribed HEP. If patient was dry needled over the lung field, patient was instructed on signs and symptoms of pneumothorax and, however unlikely, to see immediate medical attention should they occur. Patient was also educated on signs and symptoms of infection and to seek medical attention should they occur. Patient verbalized  understanding of these instructions and education.   [facility] Adult PT Treatment:                                                DATE: 08/19/22 Therapeutic Exercise: Nustep L5 5 min 273ft ambulation with 2 10# KB low carry, 412ft 10# KB high carry  Lunge to blue side bosu x15 BIL Squats holding physioball OH to promote spinal extension 15x Supine figure 4 piriformis stretch x30" BIL Supine QL stretch 30s x2 Prone prop 2 min Plank on knees 30s x1 Plank on feet 30s x2 SKTC x30" BIL DKTC x30" Seated figure 4 stretch x30"    [facility] Adult PT Treatment:                                                DATE: 08/09/22 Therapeutic Exercise: Nustep L8 8 min 2107ft ambulation with 2 10# KB low carry, 471ft 10# KB high carry  Lunge to high side of bolster 15/15 Squats holding physioball OH to promote spinal extension 15x Supine QL stretch 30s x2 Prone prop 2 min Plank on knees 30s x1 Plank on knees with hip extension 30s x2 B  [facility] Adult PT Treatment:  DATE: 08/07/22 Therapeutic Exercise: Nustep L7 8 min Lunge to high side of bolster 15/15 Goblet squats 20# DB 20x Squats holding physioball OH to promote spinal extension 15x Supine QL stretch 30s x2 Prone press 10x f/b 10x with elbow locked dropping hips f/b 10x with PT OP Plank on knees 30s x2 Plank on knees with hip extension 30s ea.     PATIENT EDUCATION:  Education details: Discussed eval findings, rehab rationale and POC and patient is in agreement  Person educated: Patient Education method: Explanation Education comprehension: verbalized understanding and needs further education  HOME EXERCISE PROGRAM: Access Code: [email or web address] URL: [email or web address] Date: 07/07/2022 Prepared by: Gustavus Bryant  Exercises - Supine Quadratus Lumborum Stretch  - 2 x daily - 5 x weekly - 1 sets - 2 reps - 30s hold - Supine March with Posterior Pelvic Tilt  - 2 x daily - 5 x weekly - 2 sets  - 10 reps - 3s hold - Hooklying Single Knee to Chest Stretch  - 2 x daily - 5 x weekly - 1 sets - 2 reps - 30s hold - Static Prone on Elbows  - 2 x daily - 5 x weekly - 1 sets - 1 reps - 2 min hold  ASSESSMENT:  CLINICAL IMPRESSION: Rehab goals met, patient ready for independent management  OBJECTIVE IMPAIRMENTS: Abnormal gait, decreased activity tolerance, decreased endurance, decreased knowledge of condition, decreased mobility, decreased ROM, decreased strength, increased fascial restrictions, impaired flexibility, postural dysfunction, and pain.   ACTIVITY LIMITATIONS: carrying, lifting, bending, sitting, standing, and squatting  PERSONAL FACTORS: Age, Fitness, and Time since onset of injury/illness/exacerbation are also affecting patient's functional outcome.   REHAB POTENTIAL: Good  CLINICAL DECISION MAKING: Stable/uncomplicated  EVALUATION COMPLEXITY: Low   GOALS: Goals reviewed with patient? No  SHORT TERM GOALS: Target date: 07/28/2022  Patient to demonstrate independence in HEP  Baseline: [email or web address] Goal status: Met  2.  Decrease 5x STS time to <15s arms crossed Baseline: 16s arms crossed; 08/09/22 8s arms crossed Goal status: Met   LONG TERM GOALS: Target date: 08/18/2022  Increase FOTO score to 60 Baseline: 49; 08/09/22 53%; 09/11/22 65% Goal status: Met  2.  Increase core/abdominal strength to 4/5 Baseline:  MMT Right eval Left eval B 09/11/22  Hip flexion 4 4 4+  Hip extension 4 4 4+  Hip abduction 4 4 4+  Hip adduction     Hip internal rotation     Hip external rotation     Knee flexion 4 4 4+  Knee extension 4 4 4+  Ankle dorsiflexion     Ankle plantarflexion 4 4 4+  Ankle inversion     Ankle eversion     Core/abdominal 3 3 4    Goal status: Met  3.  Increase BLE strength to 4+/5 Baseline:  MMT Right eval Left eval B 09/11/22  Hip flexion 4 4 4+  Hip extension 4 4 4+  Hip abduction 4 4 4+  Hip adduction     Hip internal rotation     Hip  external rotation     Knee flexion 4 4 4+  Knee extension 4 4 4+  Ankle dorsiflexion     Ankle plantarflexion 4 4 4+  Ankle inversion     Ankle eversion     Core/abdominal 3 3 4    Goal status: Met  4.  Decrease worst pain/discomfort to 4/10 Baseline: 7/10; 08/09/22 2/10 Goal status: Met  5.  Increase trunk ROM to 50% extension, 75% remaining motions  Baseline:  AROM eval  Flexion 50%  Extension 25%  Right lateral flexion 50%  Left lateral flexion 50%   Goal status: INITIAL   PLAN:  PT FREQUENCY: 1-2x/week  PT DURATION: 6 weeks  PLANNED INTERVENTIONS: Therapeutic exercises, Therapeutic activity, Neuromuscular re-education, Balance training, Gait training, Patient/Family education, Self Care, Joint mobilization, Spinal mobilization, Cryotherapy, Moist heat, Manual therapy, and Re-evaluation.  PLAN FOR NEXT SESSION: HEP review and update, manual techniques as appropriate, aerobic tasks, ROM and flexibility activities, strengthening and PREs, TPDN, gait and balance training as needed   Check all possible CPT codes: 78295 - PT Re-evaluation, 97110- Therapeutic Exercise, [phone]- Neuro Re-education, [phone] - Gait Training, [phone] - Manual Therapy, 97530 - Therapeutic Activities, and [phone] - Self Care    Check all conditions that are expected to impact treatment: {Conditions expected to impact treatment:Contractures, spasticity or fracture relevant to requested treatment   If treatment provided at initial evaluation, no treatment charged due to lack of authorization.       Hildred Laser, PT 09/11/2022, 10:44 AM
